# Patient Record
Sex: Female | Born: 1985 | Race: White | Hispanic: No | Marital: Single | Smoking: Current every day smoker
Health system: Southern US, Community
[De-identification: ages and names within clinical notes are randomized; demographics above are authoritative.]

## PROBLEM LIST (undated history)

## (undated) DIAGNOSIS — N809 Endometriosis, unspecified: Secondary | ICD-10-CM

## (undated) DIAGNOSIS — N739 Female pelvic inflammatory disease, unspecified: Secondary | ICD-10-CM

## (undated) DIAGNOSIS — F1911 Other psychoactive substance abuse, in remission: Secondary | ICD-10-CM

## (undated) HISTORY — PX: LAPAROSCOPY: SHX197

---

## 2004-01-05 ENCOUNTER — Emergency Department: Payer: Self-pay | Admitting: Internal Medicine

## 2005-09-06 ENCOUNTER — Emergency Department: Payer: Self-pay | Admitting: Emergency Medicine

## 2005-09-08 ENCOUNTER — Emergency Department: Payer: Self-pay | Admitting: Unknown Physician Specialty

## 2005-10-09 ENCOUNTER — Inpatient Hospital Stay: Payer: Self-pay | Admitting: Unknown Physician Specialty

## 2005-10-09 ENCOUNTER — Ambulatory Visit: Payer: Self-pay | Admitting: Unknown Physician Specialty

## 2005-11-07 ENCOUNTER — Ambulatory Visit: Payer: Self-pay | Admitting: Unknown Physician Specialty

## 2006-04-09 ENCOUNTER — Observation Stay: Payer: Self-pay | Admitting: Obstetrics & Gynecology

## 2006-04-10 ENCOUNTER — Ambulatory Visit: Payer: Self-pay

## 2006-07-19 ENCOUNTER — Inpatient Hospital Stay: Payer: Self-pay

## 2008-05-10 ENCOUNTER — Observation Stay: Payer: Self-pay

## 2008-05-20 ENCOUNTER — Inpatient Hospital Stay: Payer: Self-pay

## 2009-12-13 ENCOUNTER — Observation Stay: Payer: Self-pay

## 2010-01-15 ENCOUNTER — Observation Stay: Payer: Self-pay

## 2010-01-20 ENCOUNTER — Inpatient Hospital Stay: Payer: Self-pay

## 2011-07-11 ENCOUNTER — Encounter: Payer: Self-pay | Admitting: Obstetrics and Gynecology

## 2011-09-20 ENCOUNTER — Inpatient Hospital Stay: Payer: Self-pay

## 2011-09-21 LAB — CBC WITH DIFFERENTIAL/PLATELET
Basophil #: 0 10*3/uL (ref 0.0–0.1)
Eosinophil %: 0.1 %
HCT: 34.3 % — ABNORMAL LOW (ref 35.0–47.0)
Lymphocyte #: 2.6 10*3/uL (ref 1.0–3.6)
MCH: 31.2 pg (ref 26.0–34.0)
MCV: 93 fL (ref 80–100)
Monocyte %: 4.9 %
Neutrophil #: 15.2 10*3/uL — ABNORMAL HIGH (ref 1.4–6.5)
RDW: 13 % (ref 11.5–14.5)
WBC: 18.8 10*3/uL — ABNORMAL HIGH (ref 3.6–11.0)

## 2013-05-08 ENCOUNTER — Inpatient Hospital Stay: Payer: Self-pay

## 2013-05-09 LAB — CBC WITH DIFFERENTIAL/PLATELET
BASOS ABS: 0 10*3/uL (ref 0.0–0.1)
Basophil %: 0.3 %
EOS ABS: 0.1 10*3/uL (ref 0.0–0.7)
EOS PCT: 0.9 %
HCT: 35.1 % (ref 35.0–47.0)
HGB: 11.3 g/dL — ABNORMAL LOW (ref 12.0–16.0)
LYMPHS PCT: 17 %
Lymphocyte #: 2.3 10*3/uL (ref 1.0–3.6)
MCH: 29.4 pg (ref 26.0–34.0)
MCHC: 32.1 g/dL (ref 32.0–36.0)
MCV: 92 fL (ref 80–100)
MONOS PCT: 6.7 %
Monocyte #: 0.9 x10 3/mm (ref 0.2–0.9)
Neutrophil #: 10.2 10*3/uL — ABNORMAL HIGH (ref 1.4–6.5)
Neutrophil %: 75.1 %
Platelet: 424 10*3/uL (ref 150–440)
RBC: 3.83 10*6/uL (ref 3.80–5.20)
RDW: 14.3 % (ref 11.5–14.5)
WBC: 13.6 10*3/uL — AB (ref 3.6–11.0)

## 2013-05-09 LAB — DRUG SCREEN, URINE
AMPHETAMINES, UR SCREEN: NEGATIVE (ref ?–1000)
Barbiturates, Ur Screen: NEGATIVE (ref ?–200)
Benzodiazepine, Ur Scrn: NEGATIVE (ref ?–200)
CANNABINOID 50 NG, UR ~~LOC~~: NEGATIVE (ref ?–50)
Cocaine Metabolite,Ur ~~LOC~~: NEGATIVE (ref ?–300)
MDMA (Ecstasy)Ur Screen: NEGATIVE (ref ?–500)
Methadone, Ur Screen: NEGATIVE (ref ?–300)
OPIATE, UR SCREEN: POSITIVE (ref ?–300)
Phencyclidine (PCP) Ur S: NEGATIVE (ref ?–25)
TRICYCLIC, UR SCREEN: NEGATIVE (ref ?–1000)

## 2013-05-09 LAB — HEMATOCRIT: HCT: 32.3 % — ABNORMAL LOW (ref 35.0–47.0)

## 2014-01-11 ENCOUNTER — Emergency Department: Payer: Self-pay | Admitting: Emergency Medicine

## 2014-01-11 LAB — CBC WITH DIFFERENTIAL/PLATELET
Basophil #: 0.1 10*3/uL (ref 0.0–0.1)
Basophil %: 0.7 %
EOS ABS: 0.2 10*3/uL (ref 0.0–0.7)
Eosinophil %: 1.4 %
HCT: 40 % (ref 35.0–47.0)
HGB: 13.4 g/dL (ref 12.0–16.0)
LYMPHS PCT: 21.1 %
Lymphocyte #: 3.1 10*3/uL (ref 1.0–3.6)
MCH: 30.5 pg (ref 26.0–34.0)
MCHC: 33.4 g/dL (ref 32.0–36.0)
MCV: 91 fL (ref 80–100)
MONO ABS: 1 x10 3/mm — AB (ref 0.2–0.9)
Monocyte %: 7 %
NEUTROS PCT: 69.8 %
Neutrophil #: 10.4 10*3/uL — ABNORMAL HIGH (ref 1.4–6.5)
Platelet: 445 10*3/uL — ABNORMAL HIGH (ref 150–440)
RBC: 4.38 10*6/uL (ref 3.80–5.20)
RDW: 15 % — ABNORMAL HIGH (ref 11.5–14.5)
WBC: 14.9 10*3/uL — AB (ref 3.6–11.0)

## 2014-01-11 LAB — URINALYSIS, COMPLETE
Bilirubin,UR: NEGATIVE
Blood: NEGATIVE
Glucose,UR: NEGATIVE mg/dL (ref 0–75)
Ketone: NEGATIVE
NITRITE: POSITIVE
Ph: 6 (ref 4.5–8.0)
Protein: NEGATIVE
Specific Gravity: 1.02 (ref 1.003–1.030)

## 2014-01-11 LAB — COMPREHENSIVE METABOLIC PANEL
ALBUMIN: 3.3 g/dL — AB (ref 3.4–5.0)
ALT: 17 U/L
AST: 20 U/L (ref 15–37)
Alkaline Phosphatase: 131 U/L — ABNORMAL HIGH
Anion Gap: 8 (ref 7–16)
BILIRUBIN TOTAL: 0.1 mg/dL — AB (ref 0.2–1.0)
BUN: 12 mg/dL (ref 7–18)
Calcium, Total: 8.4 mg/dL — ABNORMAL LOW (ref 8.5–10.1)
Chloride: 110 mmol/L — ABNORMAL HIGH (ref 98–107)
Co2: 28 mmol/L (ref 21–32)
Creatinine: 0.68 mg/dL (ref 0.60–1.30)
EGFR (African American): >60
EGFR (Non-African Amer.): >60
Glucose: 85 mg/dL (ref 65–99)
Osmolality: 290 (ref 275–301)
POTASSIUM: 3.9 mmol/L (ref 3.5–5.1)
SODIUM: 146 mmol/L — AB (ref 136–145)
TOTAL PROTEIN: 7.5 g/dL (ref 6.4–8.2)

## 2014-01-11 LAB — LIPASE, BLOOD: Lipase: 140 U/L (ref 73–393)

## 2014-01-13 LAB — URINE CULTURE

## 2014-01-26 ENCOUNTER — Emergency Department: Payer: Self-pay | Admitting: Emergency Medicine

## 2014-01-26 LAB — COMPREHENSIVE METABOLIC PANEL
ALBUMIN: 3.4 g/dL (ref 3.4–5.0)
ALK PHOS: 115 U/L
AST: 16 U/L (ref 15–37)
Anion Gap: 3 — ABNORMAL LOW (ref 7–16)
BILIRUBIN TOTAL: 0.2 mg/dL (ref 0.2–1.0)
BUN: 12 mg/dL (ref 7–18)
CO2: 31 mmol/L (ref 21–32)
Calcium, Total: 8.7 mg/dL (ref 8.5–10.1)
Chloride: 110 mmol/L — ABNORMAL HIGH (ref 98–107)
Creatinine: 0.74 mg/dL (ref 0.60–1.30)
EGFR (Non-African Amer.): >60
Glucose: 74 mg/dL (ref 65–99)
OSMOLALITY: 285 (ref 275–301)
Potassium: 3.9 mmol/L (ref 3.5–5.1)
SGPT (ALT): 15 U/L
SODIUM: 144 mmol/L (ref 136–145)
Total Protein: 7.5 g/dL (ref 6.4–8.2)

## 2014-01-26 LAB — URINALYSIS, COMPLETE
BLOOD: NEGATIVE
Bilirubin,UR: NEGATIVE
Glucose,UR: NEGATIVE mg/dL (ref 0–75)
Ketone: NEGATIVE
Nitrite: NEGATIVE
Ph: 5 (ref 4.5–8.0)
Protein: NEGATIVE
SPECIFIC GRAVITY: 1.019 (ref 1.003–1.030)
Squamous Epithelial: 14
WBC UR: 26 /HPF (ref 0–5)

## 2014-01-26 LAB — WET PREP, GENITAL

## 2014-01-26 LAB — CBC
HCT: 41.9 % (ref 35.0–47.0)
HGB: 13.6 g/dL (ref 12.0–16.0)
MCH: 29.7 pg (ref 26.0–34.0)
MCHC: 32.5 g/dL (ref 32.0–36.0)
MCV: 91 fL (ref 80–100)
PLATELETS: 439 10*3/uL (ref 150–440)
RBC: 4.59 10*6/uL (ref 3.80–5.20)
RDW: 14.9 % — ABNORMAL HIGH (ref 11.5–14.5)
WBC: 13.3 10*3/uL — ABNORMAL HIGH (ref 3.6–11.0)

## 2014-01-27 LAB — GC/CHLAMYDIA PROBE AMP

## 2014-01-28 LAB — URINE CULTURE

## 2014-07-19 NOTE — H&P (Signed)
L&D Evaluation:  History Expanded:  HPI 29 yo [id number] at 4073w6d gestational age by LMP.  Pregnancy complicated by late entry to prenatal care.  She also has a history of delivery at home with her prior pregnancy.  She presented to L&D with contractions onset approximately 45 minutes prior to arrival to L&D.  She went on to have a precipitous spontaneous vaginal delivery (see delivery note).  She smokes ~1ppd cigarettes.  She has a past history of illicit drug use.  She had a negative urine drug screen in the beginning of February.   Gravida 7   Term 4   PreTerm 0   Abortion 2   Living 4   Blood Type (Maternal) O positive   Group B Strep Results Maternal (Result >5wks must be treated as unknown) unknown/result > 5 weeks ago   Maternal HIV Negative   Maternal Syphilis Ab Nonreactive   Maternal Varicella Immune   Rubella Results (Maternal) immune   [facility]   Patient's Medical History endometriosis   Patient's Surgical History diagnostic laparoscopy   Medications Pre Natal Vitamins   Allergies PCN   Social History tobacco  history of drug use   Family History Non-Contributory   ROS:  ROS All systems were reviewed.  HEENT, CNS, GI, GU, Respiratory, CV, Renal and Musculoskeletal systems were found to be normal., unless otherwise noted in HPI   Exam:  Vital Signs Afebrile, vitals within normal limits   General mild-moderate distress with contractions   Chest clear   Heart normal sinus rhythm   Abdomen gravid, non-tender   Estimated Fetal Weight Average for gestational age   Fetal Position ceph   Edema no edema   Pelvic 4cm at presentation, quickly progressing to fully dilated   Mebranes ruptured clear fluid at presentation   Description clear   FHT normal rate with no decels   FHT Description occasional variable decelerations   Ucx regular   Skin no lesions   Impression:  Impression active labor, PTL   Plan:  Comments Admit for labor.   Patient delivered precipitously.  See delivery note. urine drug screen for history of drug use. routine labs. patient desires BTL.  Will address with her.  paperwork signed in early February.   Electronic Signatures: Conard NovakJackson, Fantasia Jinkins D (MD)  (Signed 28-Feb-15 20:09)  Authored: L&D Evaluation   Last Updated: 28-Feb-15 20:09 by Conard NovakJackson, Recie Cirrincione D (MD)

## 2014-07-19 NOTE — H&P (Signed)
L&D Evaluation:  History:   HPI 29 y/o G6P3023 @ 37/6wks EDC 10/05/11 arrives after rapid labor and delivery at home. Per FOB, patient had some cramping that begin [place] [facility] [employer], he was preparing a bath when the water broke and she pushed once and the baby delivered, baby crying and pink. He had alerted EMS and they arrived after the delivery. Placenta delivered in postpartum room-see delivery note. 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 Multiple +UDS at [facility]. Adjustment disorder-depression anxiety. Poor weight gain~10#, low prepregnant BMI. GBS negative.    Presents with baby in arms    Medications Pre Natal Vitamins    Allergies NKDA    Social History tobacco  1ppd currrent (2ppd prior to pregnancy)    Family History Non-Contributory  FH Sotos syndrome and Absent thyroid (see [facility] perinatology notes)   ROS:   ROS All systems were reviewed.  HEENT, CNS, GI, GU, Respiratory, CV, Renal and Musculoskeletal systems were found to be normal.   Exam:   Vital Signs stable    Urine Protein not completed    General Anxious, tearful but calms with reassurance    Mental Status clear    Chest clear    Heart normal sinus rhythm    Edema no edema    Reflexes 2+   Impression:   Impression Delivered well female precipitously at home 37/6   Plan:   Comments Placenta delivered intact, intact perineum-see delivery note. NEO/NSY notified of pts history.   Electronic Signatures: Albertina ParrLugiano, Etta Gassett B (CNM)  (Signed 12-Jul-13 23:11)  Authored: L&D Evaluation   Last Updated: 12-Jul-13 23:11 by Albertina ParrLugiano, Adelita Hone B (CNM)

## 2014-11-02 ENCOUNTER — Emergency Department
Admission: EM | Admit: 2014-11-02 | Discharge: 2014-11-02 | Disposition: A | Discharge status: Home / Self Care | Payer: Self-pay | Attending: Student | Admitting: Student

## 2014-11-02 ENCOUNTER — Encounter: Payer: Self-pay | Admitting: *Deleted

## 2014-11-02 DIAGNOSIS — R102 Pelvic and perineal pain unspecified side: Secondary | ICD-10-CM

## 2014-11-02 DIAGNOSIS — Z3202 Encounter for pregnancy test, result negative: Secondary | ICD-10-CM | POA: Insufficient documentation

## 2014-11-02 DIAGNOSIS — Z880 Allergy status to penicillin: Secondary | ICD-10-CM | POA: Insufficient documentation

## 2014-11-02 DIAGNOSIS — N730 Acute parametritis and pelvic cellulitis: Secondary | ICD-10-CM

## 2014-11-02 DIAGNOSIS — Z720 Tobacco use: Secondary | ICD-10-CM | POA: Insufficient documentation

## 2014-11-02 LAB — URINALYSIS COMPLETE WITH MICROSCOPIC (ARMC ONLY)
Bilirubin Urine: NEGATIVE
Glucose, UA: NEGATIVE mg/dL
Hgb urine dipstick: NEGATIVE
Ketones, ur: NEGATIVE mg/dL
Leukocytes, UA: NEGATIVE
NITRITE: NEGATIVE
PROTEIN: NEGATIVE mg/dL
Specific Gravity, Urine: 1.018 (ref 1.005–1.030)
pH: 7 (ref 5.0–8.0)

## 2014-11-02 LAB — CHLAMYDIA/NGC RT PCR (ARMC ONLY)
CHLAMYDIA TR: NOT DETECTED
N GONORRHOEAE: NOT DETECTED

## 2014-11-02 LAB — WET PREP, GENITAL
CLUE CELLS WET PREP: NONE SEEN
Trich, Wet Prep: NONE SEEN
YEAST WET PREP: NONE SEEN

## 2014-11-02 LAB — PREGNANCY, URINE: PREG TEST UR: NEGATIVE

## 2014-11-02 MED ORDER — ONDANSETRON 4 MG PO TBDP: 4.0000 mg | ORAL_TABLET | Freq: Once | ORAL | Status: DC

## 2014-11-02 MED ORDER — LEVOFLOXACIN 500 MG PO TABS: 500.0000 mg | ORAL_TABLET | Freq: Two times a day (BID) | ORAL | Status: DC

## 2014-11-02 MED ORDER — LEVOFLOXACIN 500 MG PO TABS
500.0000 mg | ORAL_TABLET | Freq: Once | ORAL | Status: AC
  Administered 2014-11-02: 500 mg via ORAL
  Filled 2014-11-02: qty 1

## 2014-11-02 MED ORDER — AZITHROMYCIN 250 MG PO TABS
ORAL_TABLET | ORAL | Status: AC
  Administered 2014-11-02: 1000 mg
  Filled 2014-11-02: qty 4

## 2014-11-02 MED ORDER — OXYCODONE-ACETAMINOPHEN 5-325 MG PO TABS
ORAL_TABLET | ORAL | Status: AC
  Administered 2014-11-02: 16:00:00
  Filled 2014-11-02: qty 2

## 2014-11-02 MED ORDER — IBUPROFEN 600 MG PO TABS: 600.0000 mg | ORAL_TABLET | Freq: Four times a day (QID) | ORAL | Status: DC | PRN

## 2014-11-02 MED ORDER — AZITHROMYCIN 1 G PO PACK
2.0000 g | PACK | Freq: Once | ORAL | Status: AC
  Administered 2014-11-02: 1 g via ORAL
  Filled 2014-11-02: qty 2

## 2014-11-02 NOTE — ED Provider Notes (Addendum)
[facility] Emergency Department Provider Note  ____________________________________________  Time seen: Approximately 3:16 PM  I have reviewed the triage vital signs and the nursing notes.   HISTORY  Chief Complaint Pelvic Pain    HPI Andrea Mcknight is a 29 y.o. female with no chronic medical problems presents for evaluation of vaginal onset diffuse lower abdominal/pelvic pain, constant since onset for the past 3 days. Currently pain is of moderate severity, no modifying factors. Nausea, vomiting, diarrhea, fevers or chills. Denies any abnormal vaginal bleeding or vaginal discharge. States she had similar pain when she was diagnosed with a sexually transmitted infection and she has been having unprotected sex and is concerned for repeat STI.   History reviewed. No pertinent past medical history.  There are no active problems to display for this patient.   History reviewed. No pertinent past surgical history.  No current outpatient prescriptions on file.  Allergies Penicillins  No family history on file.  Social History Social History  Substance Use Topics  . Smoking status: Current Every Day Smoker -- 1.00 packs/day    Types: Cigarettes  . Smokeless tobacco: None  . Alcohol Use: No    Review of Systems Constitutional: No fever/chills Eyes: No visual changes. ENT: No sore throat. Cardiovascular: Denies chest pain. Respiratory: Denies shortness of breath. Gastrointestinal: + abdominal pain.  No nausea, no vomiting.  No diarrhea.  No constipation. Genitourinary: Negative for dysuria. Musculoskeletal: Negative for back pain. Skin: Negative for rash. Neurological: Negative for headaches, focal weakness or numbness.  10-point ROS otherwise negative.  ____________________________________________   PHYSICAL EXAM:  VITAL SIGNS: ED Triage Vitals  Enc Vitals Group     BP 11/02/14 1221 114/73 mmHg     Pulse Rate 11/02/14 1221 98     Resp  11/02/14 1221 18     Temp 11/02/14 1221 98 F (36.7 C)     Temp Source 11/02/14 1221 Oral     SpO2 11/02/14 1221 100 %     Weight 11/02/14 1221 130 lb (58.968 kg)     Height 11/02/14 1221 5\' 5"  (1.651 m)     Head Cir --      Peak Flow --      Pain Score 11/02/14 1222 8     Pain Loc --      Pain Edu? --      Excl. in GC? --     Constitutional: Alert and oriented. Well appearing and in no acute distress. Eyes: Conjunctivae are normal. PERRL. EOMI. Head: Atraumatic. Nose: No congestion/rhinnorhea. Mouth/Throat: Mucous membranes are moist.  Oropharynx non-erythematous. Neck: No stridor.   Cardiovascular: Normal rate, regular rhythm. Grossly normal heart sounds.  Good peripheral circulation. Respiratory: Normal respiratory effort.  No retractions. Lungs CTAB. Gastrointestinal: Soft with faint diffuse lower abdominal tenderness. No rebound, no guarding. No distention. No abdominal bruits. No CVA tenderness. Pelvic: Copious thick white discharge from a closed os, + CMT and + BMT Musculoskeletal: No lower extremity tenderness nor edema.  No joint effusions. Neurologic:  Normal speech and language. No gross focal neurologic deficits are appreciated. No gait instability. Skin:  Skin is warm, dry and intact. No rash noted. Psychiatric: Mood and affect are normal. Speech and behavior are normal.  ____________________________________________   LABS (all labs ordered are listed, but only abnormal results are displayed)  Labs Reviewed  WET PREP, GENITAL - Abnormal; Notable for the following:    WBC, Wet Prep HPF POC MODERATE (*)    All other components  within normal limits  URINALYSIS COMPLETEWITH MICROSCOPIC ([facility] ONLY) - Abnormal; Notable for the following:    Color, Urine YELLOW (*)    APPearance HAZY (*)    Bacteria, UA RARE (*)    Squamous Epithelial / LPF 6-30 (*)    All other components within normal limits  CHLAMYDIA/NGC RT PCR ([facility] ONLY)  PREGNANCY, URINE    ____________________________________________  EKG  none ____________________________________________  RADIOLOGY  none ____________________________________________   PROCEDURES  Procedure(s) performed: None  Critical Care performed: No  ____________________________________________   INITIAL IMPRESSION / ASSESSMENT AND PLAN / ED COURSE  Pertinent labs & imaging results that were available during my care of the patient were reviewed by me and considered in my medical decision making (see chart for details).  Andrea Mcknight is a 29 y.o. female with no chronic medical problems presents for evaluation of vaginal onset diffuse lower abdominal/pelvic pain, constant since onset for the past 3 days. On exam, she is very well-appearing and in no acute distress. Vital signs stable, she is afebrile. She has normal bowel sounds with faint lower abdominal tenderness, no laterality, no rebound, no guarding. Doubt any acute ovarian pathology such as torsion or TOA. Her exam is concerning for pelvic inflammatory disease given copious vaginal discharge, bimanual tenderness as well as cervical motion tenderness. She is severe anaphylactic allergy to penicillin so will treat with Levaquin for 14 days as well as one large dose of azithromycin here according to current CDC recommendations. Pain is improved and this time. Urine pregnancy is negative. Urinalysis is not consistent with infection. Wet prep with no trichomonas, no yeast, no clue cells although there are moderate amount of white blood cells. GC chlamydia still pending however given her exam, we'll treat empirically. Discussed return precautions, need for close PCP follow-up and she is comfortable with the discharge plan. ____________________________________________   FINAL CLINICAL IMPRESSION(S) / ED DIAGNOSES  Final diagnoses:  Pelvic pain in female  PID (acute pelvic inflammatory disease)      Gayla Doss, MD 11/02/14  1725  Gayla Doss, MD 11/02/14 [phone]

## 2014-11-02 NOTE — ED Notes (Signed)
Pt reports pelvic pain starting 3 days ago, pt denies any other symptoms

## 2014-11-08 ENCOUNTER — Telehealth: Payer: Self-pay | Admitting: Emergency Medicine

## 2014-11-08 NOTE — ED Notes (Signed)
Pharmacy called to question levaquin dosage of 2 per day.   Per dr York Cerise will change to levaquin 500 daily for 14 days.

## 2015-09-20 ENCOUNTER — Encounter: Payer: Self-pay | Admitting: *Deleted

## 2015-09-20 ENCOUNTER — Emergency Department
Admission: EM | Admit: 2015-09-20 | Discharge: 2015-09-20 | Disposition: A | Discharge status: Home / Self Care | Payer: Self-pay | Attending: Emergency Medicine | Admitting: Emergency Medicine

## 2015-09-20 ENCOUNTER — Emergency Department: Payer: Self-pay

## 2015-09-20 DIAGNOSIS — Y999 Unspecified external cause status: Secondary | ICD-10-CM | POA: Insufficient documentation

## 2015-09-20 DIAGNOSIS — F17210 Nicotine dependence, cigarettes, uncomplicated: Secondary | ICD-10-CM | POA: Insufficient documentation

## 2015-09-20 DIAGNOSIS — S61512A Laceration without foreign body of left wrist, initial encounter: Secondary | ICD-10-CM

## 2015-09-20 DIAGNOSIS — Y939 Activity, unspecified: Secondary | ICD-10-CM | POA: Insufficient documentation

## 2015-09-20 DIAGNOSIS — S6392XA Sprain of unspecified part of left wrist and hand, initial encounter: Secondary | ICD-10-CM | POA: Insufficient documentation

## 2015-09-20 DIAGNOSIS — S61522A Laceration with foreign body of left wrist, initial encounter: Secondary | ICD-10-CM | POA: Insufficient documentation

## 2015-09-20 DIAGNOSIS — W1839XA Other fall on same level, initial encounter: Secondary | ICD-10-CM | POA: Insufficient documentation

## 2015-09-20 DIAGNOSIS — Y929 Unspecified place or not applicable: Secondary | ICD-10-CM | POA: Insufficient documentation

## 2015-09-20 MED ORDER — LIDOCAINE HCL (PF) 1 % IJ SOLN
INTRAMUSCULAR | Status: AC
  Filled 2015-09-20: qty 5

## 2015-09-20 MED ORDER — TRAMADOL HCL 50 MG PO TABS: 50.0000 mg | ORAL_TABLET | Freq: Four times a day (QID) | ORAL | Status: DC | PRN

## 2015-09-20 MED ORDER — TETANUS-DIPHTH-ACELL PERTUSSIS 5-2.5-18.5 LF-MCG/0.5 IM SUSP
0.5000 mL | Freq: Once | INTRAMUSCULAR | Status: AC
  Administered 2015-09-20: 0.5 mL via INTRAMUSCULAR
  Filled 2015-09-20: qty 0.5

## 2015-09-20 MED ORDER — LIDOCAINE HCL (PF) 1 % IJ SOLN: 5.0000 mL | Freq: Once | INTRAMUSCULAR | Status: DC

## 2015-09-20 NOTE — ED Notes (Signed)
See triage note  States she tripped and fell in yard   Laceration noted to left wrist  Denies any other injury

## 2015-09-20 NOTE — ED Notes (Signed)
Pt tripped and fell in yard, pt has laceration to left wrist, no bleeding noted, wound cleansed dressing applied

## 2015-09-20 NOTE — ED Provider Notes (Signed)
[facility] Emergency Department Provider Note  ____________________________________________  Time seen: Approximately 3:10 PM  I have reviewed the triage vital signs and the nursing notes.   HISTORY  Chief Complaint Laceration   HPI Andrea Mcknight is a 30 y.o. female who presents to the emergency department for evaluation of a laceration of the left wrist she sustained after a mechanical, non-syncopal fall at approximately 1:00AM. She states she has pain with attempt to extend the fingers of the hand, especially the ring finger. She is unsure of her last tetanus.   History reviewed. No pertinent past medical history.  There are no active problems to display for this patient.   History reviewed. No pertinent past surgical history.  Current Outpatient Rx  Name  Route  Sig  Dispense  Refill  . ibuprofen (ADVIL,MOTRIN) 600 MG tablet   Oral   Take 1 tablet (600 mg total) by mouth every 6 (six) hours as needed for moderate pain.   15 tablet   0   . levofloxacin (LEVAQUIN) 500 MG tablet   Oral   Take 1 tablet (500 mg total) by mouth 2 (two) times daily.   28 tablet   0   . traMADol (ULTRAM) 50 MG tablet   Oral   Take 1 tablet (50 mg total) by mouth every 6 (six) hours as needed.   12 tablet   0     Allergies Penicillins  No family history on file.  Social History Social History  Substance Use Topics  . Smoking status: Current Every Day Smoker -- 1.00 packs/day    Types: Cigarettes  . Smokeless tobacco: None  . Alcohol Use: No    Review of Systems  Constitutional: Negative for fever/chills Respiratory: Negative for shortness of breath. Musculoskeletal: Positive for pain. Skin: Positive for laceration. Neurological: Negative for headaches, focal weakness or numbness. ____________________________________________   PHYSICAL EXAM:  VITAL SIGNS: ED Triage Vitals  Enc Vitals Group     BP 09/20/15 1500 116/65 mmHg     Pulse Rate  09/20/15 1500 91     Resp 09/20/15 1500 20     Temp 09/20/15 1500 98 F (36.7 C)     Temp Source 09/20/15 1500 Oral     SpO2 09/20/15 1500 98 %     Weight 09/20/15 1500 120 lb (54.432 kg)     Height 09/20/15 1500  (1.651 m)     Head Cir --      Peak Flow --      Pain Score 09/20/15 1500 8     Pain Loc --      Pain Edu? --      Excl. in GC? --      Constitutional: Alert and oriented. Well appearing and in no acute distress. Eyes: Conjunctivae are normal. EOMI. Nose: No congestion/rhinnorhea. Mouth/Throat: Mucous membranes are moist.   Neck: No stridor. Cardiovascular: Good peripheral circulation. Respiratory: Normal respiratory effort.  No retractions. Musculoskeletal: Limited ROM of all fingers of the left hand, especially the Ring finger. Neurologic:  Normal speech and language. No gross focal neurologic deficits are appreciated. Skin:  3 cm laceration noted to the volar aspect of the left wrist just proximal to the thenar eminence.   ____________________________________________   LABS (all labs ordered are listed, but only abnormal results are displayed)  Labs Reviewed - No data to display ____________________________________________  EKG   ____________________________________________  RADIOLOGY  Patient unable to tolerate full extension of the left hand for  complete visualization of the bony structure. No obvious dislocations or fractures in the images obtained. I, Kem Boroughsari Tanelle Lanzo, personally viewed and evaluated these images (plain radiographs) as part of my medical decision making, as well as reviewing the written report by the radiologist.   ____________________________________________   PROCEDURES  Procedure(s) performed:  LACERATION REPAIR Performed by: Kem Boroughsari Ruchel Brandenburger  Consent: Verbal consent obtained.  Consent given by: patient  Prepped and Draped in normal sterile fashion  Wound explored: 2 small foreign bodies removed.  Laceration Location:  volar aspect of the left wrist just proximal to the thenar eminence.   Laceration Length: 3 cm  Local anesthetic: lidocaine 1 % without epinephrine  Anesthetic total: 5 ml  Irrigation method: syringe  Amount of cleaning: Copious   Skin closure: 5-0 Ethilon   Number of sutures: 4  Technique: Simple interrupted   Patient tolerance: Patient tolerated the procedure well with no immediate complications.  SPLINT APPLICATION Authorized by: Kem Boroughsari Tomoki Lucken Consent: Verbal consent obtained. Risks and benefits: risks, benefits and alternatives were discussed Consent given by: patient Splint applied by: RN Location details: Left fingers to mid forearm  Splint type: OCL  Supplies used: OCL and Ace  Post-procedure: The splinted body part was neurovascularly unchanged following the procedure. Patient tolerance: Patient tolerated the procedure well with no immediate complications.     ____________________________________________   INITIAL IMPRESSION / ASSESSMENT AND PLAN / ED COURSE  Pertinent labs & imaging results that were available during my care of the patient were reviewed by me and considered in my medical decision making (see chart for details).  Patient was instructed that she will need to follow-up with orthopedics for further examination and evaluation of her left hand, specifically the ring finger. She was advised that this may be a simple sprain but could be a tendon injury which will need close follow-up in order to regain full function of the finger. Wound care instructions were discussed. She is aware that the sutures need to be removed in 10-12 days. Return precautions discussed as well.  Discharge Medication List as of 09/20/2015  4:20 PM    START taking these medications   Details  traMADol (ULTRAM) 50 MG tablet Take 1 tablet (50 mg total) by mouth every 6 (six) hours as needed., Starting 09/20/2015, Until Discontinued, Print         ____________________________________________   INITIAL IMPRESSION / ASSESSMENT AND PLAN / ED COURSE  ____________________________________________   FINAL CLINICAL IMPRESSION(S) / ED DIAGNOSES  Final diagnoses:  Hand sprain, left, initial encounter  Laceration of wrist, left, initial encounter       Chinita PesterCari B Felicitas Sine, FNP 09/20/15 1804  Nita Sicklearolina Veronese, MD 09/20/15 2358

## 2015-09-22 ENCOUNTER — Encounter: Payer: Self-pay | Admitting: Emergency Medicine

## 2015-09-22 ENCOUNTER — Emergency Department
Admission: EM | Admit: 2015-09-22 | Discharge: 2015-09-22 | Disposition: A | Discharge status: Home / Self Care | Payer: Self-pay | Attending: Emergency Medicine | Admitting: Emergency Medicine

## 2015-09-22 DIAGNOSIS — F17210 Nicotine dependence, cigarettes, uncomplicated: Secondary | ICD-10-CM | POA: Insufficient documentation

## 2015-09-22 DIAGNOSIS — Z3A00 Weeks of gestation of pregnancy not specified: Secondary | ICD-10-CM | POA: Insufficient documentation

## 2015-09-22 DIAGNOSIS — O209 Hemorrhage in early pregnancy, unspecified: Secondary | ICD-10-CM | POA: Insufficient documentation

## 2015-09-22 LAB — CBC WITH DIFFERENTIAL/PLATELET
Basophils Absolute: 0 10*3/uL (ref 0–0.1)
Basophils Relative: 1 %
EOS ABS: 0.2 10*3/uL (ref 0–0.7)
Eosinophils Relative: 2 %
HEMATOCRIT: 30.8 % — AB (ref 35.0–47.0)
HEMOGLOBIN: 10.9 g/dL — AB (ref 12.0–16.0)
LYMPHS ABS: 3.8 10*3/uL — AB (ref 1.0–3.6)
Lymphocytes Relative: 37 %
MCH: 30.6 pg (ref 26.0–34.0)
MCHC: 35.3 g/dL (ref 32.0–36.0)
MCV: 86.7 fL (ref 80.0–100.0)
MONOS PCT: 7 %
Monocytes Absolute: 0.7 10*3/uL (ref 0.2–0.9)
NEUTROS ABS: 5.6 10*3/uL (ref 1.4–6.5)
NEUTROS PCT: 53 %
Platelets: 369 10*3/uL (ref 150–440)
RBC: 3.55 MIL/uL — AB (ref 3.80–5.20)
RDW: 16.7 % — ABNORMAL HIGH (ref 11.5–14.5)
WBC: 10.3 10*3/uL (ref 3.6–11.0)

## 2015-09-22 LAB — ABO/RH: ABO/RH(D): O POS

## 2015-09-22 LAB — POCT PREGNANCY, URINE: Preg Test, Ur: NEGATIVE

## 2015-09-22 LAB — HCG, QUANTITATIVE, PREGNANCY: hCG, Beta Chain, Quant, S: 24 m[IU]/mL — ABNORMAL HIGH (ref <5)

## 2015-09-22 NOTE — ED Notes (Signed)
Pt and sister very concerned regarding wait time. Pt informed of treatment process and md delay. Pt verbalizes understanding.

## 2015-09-22 NOTE — ED Provider Notes (Signed)
[facility] Emergency Department Provider Note   ____________________________________________  Time seen:  I have reviewed the triage vital signs and the triage nursing note.  HISTORY  Chief Complaint Vaginal Bleeding   Historian Patient  HPI Andrea Mcknight is a 30 y.o. female G7 P5 who is here after taking a positive pregnancy test by urine at home before July 4. On July 4 she started having some spotting for 2 days, followed by several days of heavy vaginal bleeding with cramping. She did not seek medical evaluation at that point in time. However she started wondering what is going on and if the pregnancy was okay and since she does not have Medicaid yet and was going to take some time she chose to come to the emergency department for further evaluation today. She has not had any bleeding since that episode nor any abdominal pain today.      History reviewed. No pertinent past medical history.  There are no active problems to display for this patient.   History reviewed. No pertinent past surgical history.  Current Outpatient Rx  Name  Route  Sig  Dispense  Refill  . ibuprofen (ADVIL,MOTRIN) 600 MG tablet   Oral   Take 1 tablet (600 mg total) by mouth every 6 (six) hours as needed for moderate pain.   15 tablet   0   . levofloxacin (LEVAQUIN) 500 MG tablet   Oral   Take 1 tablet (500 mg total) by mouth 2 (two) times daily.   28 tablet   0   . traMADol (ULTRAM) 50 MG tablet   Oral   Take 1 tablet (50 mg total) by mouth every 6 (six) hours as needed.   12 tablet   0     Allergies Penicillins  No family history on file.  Social History Social History  Substance Use Topics  . Smoking status: Current Every Day Smoker -- 1.00 packs/day    Types: Cigarettes  . Smokeless tobacco: Never Used  . Alcohol Use: No    Review of Systems  Constitutional: Negative for fever. Eyes: Negative for visual changes. ENT: Negative for sore  throat. Cardiovascular: Negative for chest pain. Respiratory: Negative for shortness of breath. Gastrointestinal: Negative for abdominal pain, vomiting and diarrhea. Genitourinary: Negative for dysuria. Musculoskeletal: Negative for back pain. Skin: Negative for rash. Neurological: Negative for headache. 10 point Review of Systems otherwise negative ____________________________________________   PHYSICAL EXAM:  VITAL SIGNS: ED Triage Vitals  Enc Vitals Group     BP 09/22/15 2008 139/95 mmHg     Pulse Rate 09/22/15 2008 84     Resp 09/22/15 2008 14     Temp 09/22/15 2008 98.4 F (36.9 C)     Temp Source 09/22/15 2008 Oral     SpO2 09/22/15 2008 100 %     Weight 09/22/15 2008 120 lb (54.432 kg)     Height 09/22/15 2008 5\' 5"  (1.651 m)     Head Cir --      Peak Flow --      Pain Score 09/22/15 2009 0     Pain Loc --      Pain Edu? --      Excl. in GC? --      Constitutional: Alert and oriented. Well appearing and in no distress. HEENT   Head: Normocephalic and atraumatic.      Eyes: Conjunctivae are normal. PERRL. Normal extraocular movements.      Ears:  Nose: No congestion/rhinnorhea.   Mouth/Throat: Mucous membranes are moist.   Neck: No stridor. Cardiovascular/Chest: Normal rate, regular rhythm.  No murmurs, rubs, or gallops. Respiratory: Normal respiratory effort without tachypnea nor retractions. Breath sounds are clear and equal bilaterally. No wheezes/rales/rhonchi. Gastrointestinal: Soft. No distention, no guarding, no rebound. Nontender.   Genitourinary/rectal:Deferred Musculoskeletal: Nontender with normal range of motion in all extremities. No joint effusions.  No lower extremity tenderness.  No edema. Neurologic:  Normal speech and language. No gross or focal neurologic deficits are appreciated. Skin:  Skin is warm, dry and intact. No rash noted. Psychiatric: Mood and affect are normal. Speech and behavior are normal. Patient exhibits  appropriate insight and judgment.  ____________________________________________   EKG I, Governor Rooks, MD, the attending physician have personally viewed and interpreted all ECGs.  None ____________________________________________  LABS (pertinent positives/negatives)  Labs Reviewed  HCG, QUANTITATIVE, PREGNANCY - Abnormal; Notable for the following:    hCG, Beta Chain, Quant, S 24 (*)    All other components within normal limits  CBC WITH DIFFERENTIAL/PLATELET - Abnormal; Notable for the following:    RBC 3.55 (*)    Hemoglobin 10.9 (*)    HCT 30.8 (*)    RDW 16.7 (*)    Lymphs Abs 3.8 (*)    All other components within normal limits  POC URINE PREG, ED  POCT PREGNANCY, URINE  ABO/RH    ____________________________________________  RADIOLOGY All Xrays were viewed by me. Imaging interpreted by Radiologist.  None __________________________________________  PROCEDURES  Procedure(s) performed: None  Critical Care performed: None  ____________________________________________   ED COURSE / ASSESSMENT AND PLAN  Pertinent labs & imaging results that were available during my care of the patient were reviewed by me and considered in my medical decision making (see chart for details).   This patient's giving a history concerning for likely miscarriage about a week ago.  She had a urine pregnancy test that was positive followed by bleeding similar to a period, and came for further evaluation.  Urine pregnancy test was initially noted to be negative, however after another 10-20 minutes it did show positive. Her beta hCG did come back low at 24.  Given that she is not having abdominal pain or bleeding right now, I don't think pelvic exam is indicated right now. I did discuss offering her an ultrasound today, but patient does not want to stick around for this and that she has kids at home.  I don't think it absolutely necessary to insist on this given that she is  essentially asymptomatic now. Her blood type is O+.  I have asked her to follow up with OB/GYN.     CONSULTATIONS:   None   Patient / Family / Caregiver informed of clinical course, medical decision-making process, and agree with plan.   I discussed return precautions, follow-up instructions, and discharged instructions with patient and/or family.   ___________________________________________   FINAL CLINICAL IMPRESSION(S) / ED DIAGNOSES   Final diagnoses:  First trimester bleeding  Likely miscarriage            Note: This dictation was prepared with Dragon dictation. Any transcriptional errors that result from this process are unintentional   Governor Rooks, MD 09/22/15 2155

## 2015-09-22 NOTE — ED Notes (Signed)
Pt states took 3 urine pregnancy tests at home on 09/10/2015 with positive results. Pt states began having vaginal bleeding "like a period" on 09/12/2015. Pt states bleeding stopped on 09/16/2015. Pt states took home pregnancy test today with positive results. Pt states "i just want to make sure everything is ok because it's gonna take too long to get into a doctor because i don't have medicaid." pt appears in no acute distress.

## 2015-09-22 NOTE — ED Notes (Addendum)
Pt removed pregnancy test from trash, states 'this is positive, the line is really faint but it's there." possibly very light pink line only visible in extremely bright light present. Pt assured will know for sure from blood pregnancy test. Pt verbalizes understanding.

## 2015-09-22 NOTE — Discharge Instructions (Signed)
You were evaluated after positive pregnancy test and vaginal bleeding. As we discussed, your blood pregnancy test, beta hCG was very low at 24 today. Given your recent bleeding, I am most suspicious of a miscarriage.  It is still somewhat possible that you are just very early in pregnancy, or that there is an abnormal pregnancy, but without doing the ultrasound today, you do need to follow-up with OB/GYN.  Return to the emergency department for any abdominal pain, dizziness, passing out, or additional heavy bleeding.   Vaginal Bleeding During Pregnancy, First Trimester A small amount of bleeding (spotting) from the vagina is common in early pregnancy. Sometimes the bleeding is normal and is not a problem, and sometimes it is a sign of something serious. Be sure to tell your doctor about any bleeding from your vagina right away. HOME CARE  Watch your condition for any changes.  Follow your doctor's instructions about how active you can be.  If you are on bed rest:  You may need to stay in bed and only get up to use the bathroom.  You may be allowed to do some activities.  If you need help, make plans for someone to help you.  Write down:  The number of pads you use each day.  How often you change pads.  How soaked (saturated) your pads are.  Do not use tampons.  Do not douche.  Do not have sex or orgasms until your doctor says it is okay.  If you pass any tissue from your vagina, save the tissue so you can show it to your doctor.  Only take medicines as told by your doctor.  Do not take aspirin because it can make you bleed.  Keep all follow-up visits as told by your doctor. GET HELP IF:   You bleed from your vagina.  You have cramps.  You have labor pains.  You have a fever that does not go away after you take medicine. GET HELP RIGHT AWAY IF:   You have very bad cramps in your back or belly (abdomen).  You pass large clots or tissue from your vagina.  You  bleed more.  You feel light-headed or weak.  You pass out (faint).  You have chills.  You are leaking fluid or have a gush of fluid from your vagina.  You pass out while pooping (having a bowel movement). MAKE SURE YOU:  Understand these instructions.  Will watch your condition.  Will get help right away if you are not doing well or get worse.   This information is not intended to replace advice given to you by your health care provider. Make sure you discuss any questions you have with your health care provider.   Document Released: 07/12/2013 Document Reviewed: 07/12/2013 Elsevier Interactive Patient Education [employer]2016 Elsevier Inc. I discussed,

## 2015-09-22 NOTE — ED Notes (Signed)
Pt and pt's sister calling this rn back into room. Pt and sister state "that test is positive, look at that faint line." no line visualized by this rn. Pt assured test appears as it comes out of package.  Pt informed will talk to md to see if blood work is necessary.

## 2015-11-02 ENCOUNTER — Emergency Department
Admission: EM | Admit: 2015-11-02 | Discharge: 2015-11-03 | Disposition: A | Discharge status: Home / Self Care | Payer: Self-pay | Attending: Emergency Medicine | Admitting: Emergency Medicine

## 2015-11-02 ENCOUNTER — Encounter: Payer: Self-pay | Admitting: Emergency Medicine

## 2015-11-02 DIAGNOSIS — Z791 Long term (current) use of non-steroidal anti-inflammatories (NSAID): Secondary | ICD-10-CM | POA: Insufficient documentation

## 2015-11-02 DIAGNOSIS — F17210 Nicotine dependence, cigarettes, uncomplicated: Secondary | ICD-10-CM | POA: Insufficient documentation

## 2015-11-02 DIAGNOSIS — Z792 Long term (current) use of antibiotics: Secondary | ICD-10-CM | POA: Insufficient documentation

## 2015-11-02 DIAGNOSIS — K047 Periapical abscess without sinus: Secondary | ICD-10-CM | POA: Insufficient documentation

## 2015-11-02 MED ORDER — LIDOCAINE VISCOUS 2 % MT SOLN
15.0000 mL | Freq: Once | OROMUCOSAL | Status: AC
  Administered 2015-11-03: 15 mL via OROMUCOSAL
  Filled 2015-11-02: qty 15

## 2015-11-02 MED ORDER — CLINDAMYCIN HCL 150 MG PO CAPS: 150.0000 mg | ORAL_CAPSULE | Freq: Four times a day (QID) | ORAL | 0 refills | Status: DC

## 2015-11-02 MED ORDER — OXYCODONE-ACETAMINOPHEN 7.5-325 MG PO TABS: 1.0000 | ORAL_TABLET | ORAL | 0 refills | Status: DC | PRN

## 2015-11-02 MED ORDER — OXYCODONE-ACETAMINOPHEN 5-325 MG PO TABS
2.0000 | ORAL_TABLET | Freq: Once | ORAL | Status: AC
  Administered 2015-11-03: 2 via ORAL
  Filled 2015-11-02: qty 2

## 2015-11-02 NOTE — ED Provider Notes (Signed)
[facility] Emergency Department Provider Note   ____________________________________________   None    (approximate)  I have reviewed the triage vital signs and the nursing notes.   HISTORY  Chief Complaint Abscess    HPI Andrea Mcknight is a 30 y.o. female complain right lower molar pain and edema right lower jaw. Onset of pain began last night patient awakened this morning with swelling. No palliative measures taken for this complaint. Patient rates pain as a 10 over 10. Patient had pain as sharp.   History reviewed. No pertinent past medical history.  There are no active problems to display for this patient.   History reviewed. No pertinent surgical history.  Prior to Admission medications   Medication Sig Start Date End Date Taking? Authorizing Provider  clindamycin (CLEOCIN) 150 MG capsule Take 1 capsule (150 mg total) by mouth 4 (four) times daily. 11/02/15   Joni Reiningonald K Ahron Hulbert, PA-C  ibuprofen (ADVIL,MOTRIN) 600 MG tablet Take 1 tablet (600 mg total) by mouth every 6 (six) hours as needed for moderate pain. 11/02/14   Gayla DossEryka A Gayle, MD  levofloxacin (LEVAQUIN) 500 MG tablet Take 1 tablet (500 mg total) by mouth 2 (two) times daily. 11/02/14   Gayla DossEryka A Gayle, MD  oxyCODONE-acetaminophen (PERCOCET) 7.5-325 MG tablet Take 1 tablet by mouth every 4 (four) hours as needed for severe pain. 11/02/15   Joni Reiningonald K Catelyn Friel, PA-C  traMADol (ULTRAM) 50 MG tablet Take 1 tablet (50 mg total) by mouth every 6 (six) hours as needed. 09/20/15   Chinita Pesterari B Triplett, FNP    Allergies Penicillins and Tramadol  No family history on file.  Social History Social History  Substance Use Topics  . Smoking status: Current Every Day Smoker    Packs/day: 1.00    Types: Cigarettes  . Smokeless tobacco: Never Used  . Alcohol use No    Review of Systems Constitutional: No fever/chills Eyes: No visual changes. ENT: No sore throat. Pain and edema right lower molar  area Cardiovascular: Denies chest pain. Respiratory: Denies shortness of breath. Gastrointestinal: No abdominal pain.  No nausea, no vomiting.  No diarrhea.  No constipation. Genitourinary: Negative for dysuria. Musculoskeletal: Negative for back pain. Skin: Negative for rash. Neurological: Negative for headaches, focal weakness or numbness.    ____________________________________________   PHYSICAL EXAM:  VITAL SIGNS: ED Triage Vitals  Enc Vitals Group     BP 11/02/15 2253 95/66     Pulse Rate 11/02/15 2253 (!) 118     Resp 11/02/15 2253 20     Temp 11/02/15 2253 98.1 F (36.7 C)     Temp Source 11/02/15 2253 Oral     SpO2 11/02/15 2253 99 %     Weight 11/02/15 2254 120 lb (54.4 kg)     Height 11/02/15 2254 5\' 4"  (1.626 m)     Head Circumference --      Peak Flow --      Pain Score 11/02/15 2254 10     Pain Loc --      Pain Edu? --      Excl. in GC? --     Constitutional: Alert and oriented. Well appearing and in no acute distress. Eyes: Conjunctivae are normal. PERRL. EOMI. Head: Atraumatic. Nose: No congestion/rhinnorhea. Mouth/Throat: Mucous membranes are moist.  Oropharynx non-erythematous .Devitalized tooth #22 with edematous gingiva. Neck: No stridor.  No cervical spine tenderness to palpation. Hematological/Lymphatic/Immunilogical: No cervical lymphadenopathy. Cardiovascular: Tachycardic. Grossly normal heart sounds.  Good peripheral circulation. Respiratory:  Normal respiratory effort.  No retractions. Lungs CTAB. Gastrointestinal: Soft and nontender. No distention. No abdominal bruits. No CVA tenderness. Musculoskeletal: No lower extremity tenderness nor edema.  No joint effusions. Neurologic:  Normal speech and language. No gross focal neurologic deficits are appreciated. No gait instability. Skin:  Skin is warm, dry and intact. No rash noted. Psychiatric: Mood and affect are normal. Speech and behavior are  normal.  ____________________________________________   LABS (all labs ordered are listed, but only abnormal results are displayed)  Labs Reviewed - No data to display ____________________________________________  EKG   ____________________________________________  RADIOLOGY   ____________________________________________   PROCEDURES  Procedure(s) performed: None  Procedures  Critical Care performed: No  ____________________________________________   INITIAL IMPRESSION / ASSESSMENT AND PLAN / ED COURSE  Pertinent labs & imaging results that were available during my care of the patient were reviewed by me and considered in my medical decision making (see chart for details).  Dental abscess. Patient given discharge care instructions. Patient given a list dental clinic for follow-up care. Patient given a prescription for clindamycin and Percocets.  Clinical Course     ____________________________________________   FINAL CLINICAL IMPRESSION(S) / ED DIAGNOSES  Final diagnoses:  Dental abscess      NEW MEDICATIONS STARTED DURING THIS VISIT:  New Prescriptions   CLINDAMYCIN (CLEOCIN) 150 MG CAPSULE    Take 1 capsule (150 mg total) by mouth 4 (four) times daily.   OXYCODONE-ACETAMINOPHEN (PERCOCET) 7.5-325 MG TABLET    Take 1 tablet by mouth every 4 (four) hours as needed for severe pain.     Note:  This document was prepared using Dragon voice recognition software and may include unintentional dictation errors.    Joni Reining, PA-C 11/02/15 2348    Charlynne Pander, MD 11/04/15 2135

## 2015-11-02 NOTE — Discharge Instructions (Signed)
On a scheduled appointment from list of dental clinics provided. OPTIONS FOR DENTAL FOLLOW UP CARE  [place] Department of Health and Human Services - Local Safety Net Dental Clinics [email or web address].gov/dph/oralhealth/services/[email or web address].htm   [facility] [phone])  [facility] [phone])  [facility] [phone])  [facility] [phone])  [facility] [phone]) This clinic caters to the indigent population and is on a lottery system. Location: [employer], [address], [address], [place] [facility] Hours: Wednesdays from 6pm - 9pm, patients seen by a lottery system. For dates, call or go to [email or web address] Services: Cleanings, fillings and simple extractions. Payment Options: DENTAL WORK IS FREE OF CHARGE. Bring proof of income or support. Best way to get seen: Arrive at 5:15 pm - this is a lottery, NOT first come/first serve, so arriving earlier will not increase your chances of being seen.     [facility] [phone] Select option 1 for emergencies   Location: [facility], [place], [address], [place] [facility] Hours: No walk-ins accepted - call the day before to schedule an appointment. Check in times are 9:30 am and 1:30 pm. Services: Simple extractions, temporary fillings, pulpectomy/pulp debridement, uncomplicated abscess drainage. Payment Options: PAYMENT IS DUE AT THE TIME OF SERVICE.  Fee is usually $100-200, additional surgical procedures (e.g. abscess drainage) may be extra. Cash, checks, Visa/MasterCard accepted.  Can file Medicaid if patient is covered for dental - patient should call case worker to check. No discount for [facility] patients. Best way to get seen: MUST call the day before and get onto the schedule. Can usually be seen the next 1-2 days. No walk-ins  accepted.     [facility] [phone]   Location: [facility], [address], [place] [facility] Hours: M, W, Th, F 8am or 1:30pm, Tues 9a or 1:30 - first come/first served. Services: Simple extractions, temporary fillings, uncomplicated abscess drainage.  You do not need to be an [place] resident. Payment Options: PAYMENT IS DUE AT THE TIME OF SERVICE. Dental insurance, otherwise sliding scale - bring proof of income or support. Depending on income and treatment needed, cost is usually $50-200. Best way to get seen: Arrive early as it is first come/first served.     [facility] [phone]   Location: [address] Clinic Hours: Mon-Thu 8a-5p Services: Most basic dental services including extractions and fillings. Payment Options: PAYMENT IS DUE AT THE TIME OF SERVICE. Sliding scale, up to 50% off - bring proof if income or support. Medicaid with dental option accepted. Best way to get seen: Call to schedule an appointment, can usually be seen within 2 weeks OR they will try to see walk-ins - show up at 8a or 2p (you may have to wait).     [facility] [phone] [place] RESIDENTS ONLY   Location: [facility], [address], [place], [place] [address] Clinic Hours: By appointment only. Monday - Thursday 8am-5pm, Friday 8am-12pm Services: Cleanings, fillings, extractions. Payment Options: PAYMENT IS DUE AT THE TIME OF SERVICE. Cash, Visa or MasterCard. Sliding scale - $30 minimum per service. Best way to get seen: Come in to office, complete packet and make an appointment - need proof of income or support monies for each household member and proof of [place] residence. Usually takes about a month to get in.     [facility] [phone]   Location: [address] [facility] Hours: Walk-in  Urgent Care Dental Services are offered Monday-Friday mornings only. The numbers of emergencies accepted daily is limited to the number of providers available. Maximum 15 - Mondays, Wednesdays & Thursdays Maximum 10 - Tuesdays & Fridays Services: You do not need to be a [facility] resident to be seen for a dental emergency. Emergencies are defined as pain, swelling, abnormal bleeding, or dental trauma. Walkins will receive x-rays if needed. NOTE: Dental cleaning is not an emergency. Payment Options: PAYMENT IS DUE AT THE TIME OF SERVICE. Minimum co-pay is $40.00 for uninsured patients. Minimum co-pay is $3.00 for Medicaid with dental coverage. Dental Insurance is accepted and must be presented at time of visit. Medicare does not cover dental. Forms of payment: Cash, credit card, checks. Best way to get seen: If not previously registered with the clinic, walk-in dental registration begins at 7:15 am and is on a first come/first serve basis. If previously registered with the clinic, call to make an appointment.     [facility] [place] ONLY   Location: [address] [place], [place] Clinic Hours: Mon-Thu 10a-2p Services: Extractions only! Payment Options: FREE (donations accepted) - bring proof of income or support Best way to get seen: Call and schedule an appointment OR come at 8am on the 1st Monday of every month (except for holidays) when it is first come/first served.     [facility] [phone]   Location: [place], [place] Clinic Hours: Friday mornings Services, Payment Options, Best way to get seen: Call for info

## 2015-11-02 NOTE — ED Triage Notes (Signed)
Pt presents to ED with c/o abscess tooth right lower jaw. Swelling noted to right side of face. Pt reports pain began last night but woke up today with swelling. Pt denies trouble breathing.

## 2016-03-01 ENCOUNTER — Encounter: Payer: Self-pay | Admitting: Emergency Medicine

## 2016-03-01 ENCOUNTER — Emergency Department
Admission: EM | Admit: 2016-03-01 | Discharge: 2016-03-01 | Disposition: A | Discharge status: Home / Self Care | Payer: Self-pay | Attending: Emergency Medicine | Admitting: Emergency Medicine

## 2016-03-01 DIAGNOSIS — Y929 Unspecified place or not applicable: Secondary | ICD-10-CM | POA: Insufficient documentation

## 2016-03-01 DIAGNOSIS — Y999 Unspecified external cause status: Secondary | ICD-10-CM | POA: Insufficient documentation

## 2016-03-01 DIAGNOSIS — S39012A Strain of muscle, fascia and tendon of lower back, initial encounter: Secondary | ICD-10-CM

## 2016-03-01 DIAGNOSIS — N3090 Cystitis, unspecified without hematuria: Secondary | ICD-10-CM

## 2016-03-01 DIAGNOSIS — B9689 Other specified bacterial agents as the cause of diseases classified elsewhere: Secondary | ICD-10-CM

## 2016-03-01 DIAGNOSIS — Z79899 Other long term (current) drug therapy: Secondary | ICD-10-CM | POA: Insufficient documentation

## 2016-03-01 DIAGNOSIS — Y939 Activity, unspecified: Secondary | ICD-10-CM | POA: Insufficient documentation

## 2016-03-01 DIAGNOSIS — X58XXXA Exposure to other specified factors, initial encounter: Secondary | ICD-10-CM | POA: Insufficient documentation

## 2016-03-01 DIAGNOSIS — N730 Acute parametritis and pelvic cellulitis: Secondary | ICD-10-CM

## 2016-03-01 DIAGNOSIS — F17210 Nicotine dependence, cigarettes, uncomplicated: Secondary | ICD-10-CM | POA: Insufficient documentation

## 2016-03-01 DIAGNOSIS — N760 Acute vaginitis: Secondary | ICD-10-CM | POA: Insufficient documentation

## 2016-03-01 LAB — URINALYSIS, ROUTINE W REFLEX MICROSCOPIC
Bilirubin Urine: NEGATIVE
Glucose, UA: NEGATIVE mg/dL
Ketones, ur: NEGATIVE mg/dL
Nitrite: POSITIVE — AB
PROTEIN: 100 mg/dL — AB
SPECIFIC GRAVITY, URINE: 1.02 (ref 1.005–1.030)
pH: 6 (ref 5.0–8.0)

## 2016-03-01 LAB — WET PREP, GENITAL
SPERM: NONE SEEN
TRICH WET PREP: NONE SEEN
YEAST WET PREP: NONE SEEN

## 2016-03-01 LAB — CHLAMYDIA/NGC RT PCR (ARMC ONLY)
CHLAMYDIA TR: NOT DETECTED
N gonorrhoeae: NOT DETECTED

## 2016-03-01 LAB — PREGNANCY, URINE: Preg Test, Ur: NEGATIVE

## 2016-03-01 MED ORDER — METRONIDAZOLE 500 MG PO TABS: 500.0000 mg | ORAL_TABLET | Freq: Two times a day (BID) | ORAL | 0 refills | Status: DC

## 2016-03-01 MED ORDER — METOCLOPRAMIDE HCL 10 MG PO TABS: 10.0000 mg | ORAL_TABLET | Freq: Four times a day (QID) | ORAL | 0 refills | Status: DC | PRN

## 2016-03-01 MED ORDER — LEVOFLOXACIN 500 MG PO TABS
500.0000 mg | ORAL_TABLET | Freq: Once | ORAL | Status: AC
  Administered 2016-03-01: 500 mg via ORAL
  Filled 2016-03-01: qty 1

## 2016-03-01 MED ORDER — NAPROXEN 500 MG PO TABS: 500.0000 mg | ORAL_TABLET | Freq: Two times a day (BID) | ORAL | 0 refills | Status: DC

## 2016-03-01 MED ORDER — LEVOFLOXACIN 500 MG PO TABS: 500.0000 mg | ORAL_TABLET | Freq: Every day | ORAL | 0 refills | Status: AC

## 2016-03-01 MED ORDER — AZITHROMYCIN 500 MG PO TABS
2000.0000 mg | ORAL_TABLET | Freq: Once | ORAL | Status: AC
  Administered 2016-03-01: 2000 mg via ORAL
  Filled 2016-03-01: qty 4

## 2016-03-01 MED ORDER — DOXYCYCLINE HYCLATE 100 MG PO TABS
100.0000 mg | ORAL_TABLET | Freq: Once | ORAL | Status: AC
  Administered 2016-03-01: 100 mg via ORAL
  Filled 2016-03-01: qty 1

## 2016-03-01 MED ORDER — DOXYCYCLINE HYCLATE 100 MG PO CAPS: 100.0000 mg | ORAL_CAPSULE | Freq: Two times a day (BID) | ORAL | 0 refills | Status: DC

## 2016-03-01 MED ORDER — ONDANSETRON 4 MG PO TBDP
8.0000 mg | ORAL_TABLET | Freq: Once | ORAL | Status: AC
  Administered 2016-03-01: 8 mg via ORAL
  Filled 2016-03-01: qty 2

## 2016-03-01 MED ORDER — KETOROLAC TROMETHAMINE 60 MG/2ML IM SOLN
15.0000 mg | Freq: Once | INTRAMUSCULAR | Status: AC
Start: 2016-03-01 — End: 2016-03-01
  Administered 2016-03-01: 15 mg via INTRAMUSCULAR
  Filled 2016-03-01: qty 2

## 2016-03-01 NOTE — ED Triage Notes (Signed)
Patient ambulatory to triage with steady gait, without difficulty or distress noted; pt reports lower back pain x 3 days with no accomp symptoms; denies any known injury

## 2016-03-01 NOTE — ED Provider Notes (Signed)
[facility] Emergency Department Provider Note  ____________________________________________  Time seen: Approximately 8:37 AM  I have reviewed the triage vital signs and the nursing notes.   HISTORY  Chief Complaint Back Pain    HPI Andrea Mcknight is a 30 y.o. female who complains of low back pain radiating into "all my legs" for the past 3 days. Reports it as sudden onset, severe, unremitting. No fever nausea vomiting chest pain or shortness of breath. No prior surgeries. No recent falls trips or slips. No history of back injury. No history of IV drug use.  She is concerned she could have an STI because her boyfriend recently was diagnosed with 1. She states that "there is no telling" how long he has had the infection.   History reviewed. No pertinent past medical history.   There are no active problems to display for this patient.    History reviewed. No pertinent surgical history.   Prior to Admission medications   Medication Sig Start Date End Date Taking? Authorizing Provider  clindamycin (CLEOCIN) 150 MG capsule Take 1 capsule (150 mg total) by mouth 4 (four) times daily. 11/02/15   Joni Reining, PA-C  doxycycline (VIBRAMYCIN) 100 MG capsule Take 1 capsule (100 mg total) by mouth 2 (two) times daily. 03/01/16   Sharman Cheek, MD  ibuprofen (ADVIL,MOTRIN) 600 MG tablet Take 1 tablet (600 mg total) by mouth every 6 (six) hours as needed for moderate pain. 11/02/14   Gayla Doss, MD  levofloxacin (LEVAQUIN) 500 MG tablet Take 1 tablet (500 mg total) by mouth daily. 03/01/16 03/11/16  Sharman Cheek, MD  metoCLOPramide (REGLAN) 10 MG tablet Take 1 tablet (10 mg total) by mouth every 6 (six) hours as needed. 03/01/16   Sharman Cheek, MD  metroNIDAZOLE (FLAGYL) 500 MG tablet Take 1 tablet (500 mg total) by mouth 2 (two) times daily. 03/01/16   Sharman Cheek, MD  naproxen (NAPROSYN) 500 MG tablet Take 1 tablet (500 mg total) by mouth 2 (two)  times daily with a meal. 03/01/16   Sharman Cheek, MD  oxyCODONE-acetaminophen (PERCOCET) 7.5-325 MG tablet Take 1 tablet by mouth every 4 (four) hours as needed for severe pain. 11/02/15   Joni Reining, PA-C  traMADol (ULTRAM) 50 MG tablet Take 1 tablet (50 mg total) by mouth every 6 (six) hours as needed. 09/20/15   Chinita Pester, FNP     Allergies Penicillins and Tramadol Penicillin causes throat swelling  No family history on file.  Social History Social History  Substance Use Topics  . Smoking status: Current Every Day Smoker    Packs/day: 1.00    Types: Cigarettes  . Smokeless tobacco: Never Used  . Alcohol use No    Review of Systems  Constitutional:   No fever or chills.  ENT:   No sore throat. No rhinorrhea. Cardiovascular:   No chest pain. Respiratory:   No dyspnea or cough. Gastrointestinal:   Positive lower abdominal pain without vomiting and diarrhea. Normal BM yesterday Genitourinary:   Negative for dysuria or difficulty urinating. Denies vaginal discharge or bleeding Musculoskeletal:   Positive lower back pain Neurological:   Negative for headaches. No saddle anesthesia, lower extremity weakness, or paresthesias. No bowel or bladder retention or incontinence. 10-point ROS otherwise negative.  ____________________________________________   PHYSICAL EXAM:  VITAL SIGNS: ED Triage Vitals  Enc Vitals Group     BP --      Pulse --      Resp --  Temp --      Temp Source 03/01/16 0617 Oral     SpO2 --      Weight --      Height --      Head Circumference --      Peak Flow --      Pain Score 03/01/16 0634 10     Pain Loc --      Pain Edu? --      Excl. in GC? --     Vital signs reviewed, nursing assessments reviewed.   Constitutional:   Alert and oriented. Tearful but overall well appearing and not in distress. Eyes:   No scleral icterus. No conjunctival pallor. PERRL. EOMI.  No nystagmus. ENT   Head:   Normocephalic and  atraumatic.   Nose:   No congestion/rhinnorhea. No septal hematoma   Mouth/Throat:   MMM, no pharyngeal erythema. No peritonsillar mass.    Neck:   No stridor. No SubQ emphysema. No meningismus. Hematological/Lymphatic/Immunilogical:   No cervical lymphadenopathy. Cardiovascular:   RRR. Symmetric bilateral radial and DP pulses.  No murmurs.  Respiratory:   Normal respiratory effort without tachypnea nor retractions. Breath sounds are clear and equal bilaterally. No wheezes/rales/rhonchi. Gastrointestinal:   Soft with generalized tenderness. Non distended. There is no CVA tenderness.  No rebound, rigidity, or guarding. Genitourinary:   Pelvic exam performed with nurse Janie at bedside. External exam unremarkable. Significant pain with speculum exam. Friable cervix, copious purulent discharge. On bimanual exam, there is positive CMT, no adnexal tenderness or mass. Uterine manipulation elicits further pain Musculoskeletal:   Nontender with normal range of motion in all extremities. No joint effusions.  No lower extremity tenderness.  No edema. There is bilateral soft tissue tenderness in the paraspinous musculature of the lumbar back., Left greater than right. Worse with movement but patient is able to change position and move around forcefully on the stretcher without difficulty. No focal midline spinal tenderness. Neurologic:   Normal speech and language.  CN 2-10 normal. Motor grossly intact. No gross focal neurologic deficits are appreciated.  Skin:    Skin is warm, dry and intact. No rash noted.  No petechiae, purpura, or bullae.  ____________________________________________    LABS (pertinent positives/negatives) (all labs ordered are listed, but only abnormal results are displayed) Labs Reviewed  WET PREP, GENITAL - Abnormal; Notable for the following:       Result Value   Clue Cells Wet Prep HPF POC PRESENT (*)    WBC, Wet Prep HPF POC MANY (*)    All other components within  normal limits  URINALYSIS, ROUTINE W REFLEX MICROSCOPIC - Abnormal; Notable for the following:    Color, Urine YELLOW (*)    APPearance CLOUDY (*)    Hgb urine dipstick MODERATE (*)    Protein, ur 100 (*)    Nitrite POSITIVE (*)    Leukocytes, UA SMALL (*)    Bacteria, UA MANY (*)    Squamous Epithelial / LPF 0-5 (*)    All other components within normal limits  CHLAMYDIA/NGC RT PCR ([facility] ONLY)  URINE CULTURE  PREGNANCY, URINE   ____________________________________________   EKG    ____________________________________________    RADIOLOGY    ____________________________________________   PROCEDURES Procedures  ____________________________________________   INITIAL IMPRESSION / ASSESSMENT AND PLAN / ED COURSE  Pertinent labs & imaging results that were available during my care of the patient were reviewed by me and considered in my medical decision making (see chart for details).  Patient  presents with reported symptoms most consistent with musculoskeletal low back pain. However, per report of her boyfriend being diagnosed with STI does reveal an exam consistent with PID. This may be related to her low back pain is referred pain as well. No frank peritonitis. No evidence of TOA. Low suspicion for torsion. Pregnancy negative.Considering the patient's symptoms, medical history, and physical examination today, I have low suspicion for cholecystitis or biliary pathology, pancreatitis, perforation or bowel obstruction, hernia, intra-abdominal abscess, AAA or dissection, volvulus or intussusception, mesenteric ischemia, or appendicitis.  Low suspicion for epidural abscess or hematoma or cauda equina syndrome.  Due to reported penicillin allergy I'll treat the patient with high dose azithromycin here, prescriptions for Flagyl, Levaquin, doxycycline. Referral to health department and she does not have a primary care doctor. Return precautions given.  Urinalysis also  consistent with urinary tract infection, nitrite positive. This should be covered by the Levaquin. Urine culture sent. No evidence of pyelonephritis or sepsis.   Clinical Course    ____________________________________________   FINAL CLINICAL IMPRESSION(S) / ED DIAGNOSES  Final diagnoses:  PID (acute pelvic inflammatory disease)  Strain of lumbar region, initial encounter  Bacterial vaginosis Cystitis    New Prescriptions   DOXYCYCLINE (VIBRAMYCIN) 100 MG CAPSULE    Take 1 capsule (100 mg total) by mouth 2 (two) times daily.   LEVOFLOXACIN (LEVAQUIN) 500 MG TABLET    Take 1 tablet (500 mg total) by mouth daily.   METOCLOPRAMIDE (REGLAN) 10 MG TABLET    Take 1 tablet (10 mg total) by mouth every 6 (six) hours as needed.   METRONIDAZOLE (FLAGYL) 500 MG TABLET    Take 1 tablet (500 mg total) by mouth 2 (two) times daily.   NAPROXEN (NAPROSYN) 500 MG TABLET    Take 1 tablet (500 mg total) by mouth 2 (two) times daily with a meal.     Portions of this note were generated with dragon dictation software. Dictation errors may occur despite best attempts at proofreading.    Sharman CheekPhillip Sharone Picchi, MD 03/01/16 [phone]

## 2016-03-01 NOTE — ED Notes (Signed)
Pt is alert and orient pt restless facial grimacing pt reports lower back pain for 3 days. Pt is concerned has std due to boyfriend having one. Not sure which one.Started with bilateral leg pain then pain moved to lower  back. Pt states only urine frequency. Cough non productive as well. No distress noted.

## 2016-03-03 LAB — URINE CULTURE: Culture: >=100000 — AB

## 2016-11-17 ENCOUNTER — Emergency Department
Admission: EM | Admit: 2016-11-17 | Discharge: 2016-11-17 | Disposition: A | Discharge status: Home / Self Care | Payer: Self-pay | Attending: Emergency Medicine | Admitting: Emergency Medicine

## 2016-11-17 DIAGNOSIS — R079 Chest pain, unspecified: Secondary | ICD-10-CM

## 2016-11-17 DIAGNOSIS — F17210 Nicotine dependence, cigarettes, uncomplicated: Secondary | ICD-10-CM | POA: Insufficient documentation

## 2016-11-17 DIAGNOSIS — F1990 Other psychoactive substance use, unspecified, uncomplicated: Secondary | ICD-10-CM | POA: Insufficient documentation

## 2016-11-17 DIAGNOSIS — F1910 Other psychoactive substance abuse, uncomplicated: Secondary | ICD-10-CM

## 2016-11-17 DIAGNOSIS — Z79899 Other long term (current) drug therapy: Secondary | ICD-10-CM | POA: Insufficient documentation

## 2016-11-17 LAB — COMPREHENSIVE METABOLIC PANEL
ALT: 11 U/L — ABNORMAL LOW (ref 14–54)
ANION GAP: 11 (ref 5–15)
AST: 16 U/L (ref 15–41)
Albumin: 4.1 g/dL (ref 3.5–5.0)
Alkaline Phosphatase: 75 U/L (ref 38–126)
BILIRUBIN TOTAL: 0.4 mg/dL (ref 0.3–1.2)
BUN: 16 mg/dL (ref 6–20)
CHLORIDE: 105 mmol/L (ref 101–111)
CO2: 23 mmol/L (ref 22–32)
Calcium: 10.2 mg/dL (ref 8.9–10.3)
Creatinine, Ser: 0.59 mg/dL (ref 0.44–1.00)
GFR calc Af Amer: >60 mL/min (ref >60)
Glucose, Bld: 72 mg/dL (ref 65–99)
POTASSIUM: 3.4 mmol/L — AB (ref 3.5–5.1)
Sodium: 139 mmol/L (ref 135–145)
TOTAL PROTEIN: 7.1 g/dL (ref 6.5–8.1)

## 2016-11-17 LAB — TROPONIN I

## 2016-11-17 LAB — CBC WITH DIFFERENTIAL/PLATELET
BASOS ABS: 0 10*3/uL (ref 0–0.1)
Basophils Relative: 1 %
Eosinophils Absolute: 0.1 10*3/uL (ref 0–0.7)
Eosinophils Relative: 2 %
HCT: 38.1 % (ref 35.0–47.0)
HEMOGLOBIN: 13.1 g/dL (ref 12.0–16.0)
LYMPHS ABS: 2.1 10*3/uL (ref 1.0–3.6)
LYMPHS PCT: 34 %
MCH: 31.2 pg (ref 26.0–34.0)
MCHC: 34.5 g/dL (ref 32.0–36.0)
MCV: 90.5 fL (ref 80.0–100.0)
Monocytes Absolute: 0.7 10*3/uL (ref 0.2–0.9)
Monocytes Relative: 11 %
NEUTROS ABS: 3.1 10*3/uL (ref 1.4–6.5)
NEUTROS PCT: 52 %
PLATELETS: 330 10*3/uL (ref 150–440)
RBC: 4.21 MIL/uL (ref 3.80–5.20)
RDW: 14.4 % (ref 11.5–14.5)
WBC: 6 10*3/uL (ref 3.6–11.0)

## 2016-11-17 LAB — ETHANOL

## 2016-11-17 LAB — ACETAMINOPHEN LEVEL: Acetaminophen (Tylenol), Serum: <10 ug/mL — ABNORMAL LOW (ref 10–30)

## 2016-11-17 LAB — SALICYLATE LEVEL: Salicylate Lvl: <7 mg/dL (ref 2.8–30.0)

## 2016-11-17 MED ORDER — KETOROLAC TROMETHAMINE 30 MG/ML IJ SOLN
15.0000 mg | Freq: Once | INTRAMUSCULAR | Status: AC
  Administered 2016-11-17: 15 mg via INTRAVENOUS
  Filled 2016-11-17: qty 1

## 2016-11-17 MED ORDER — SODIUM CHLORIDE 0.9 % IV BOLUS (SEPSIS)
1000.0000 mL | Freq: Once | INTRAVENOUS | Status: AC
  Administered 2016-11-17: 1000 mL via INTRAVENOUS

## 2016-11-17 NOTE — ED Triage Notes (Addendum)
Per EMS pt was at home and did meth this morning along with marijuana.  Pts boyfriend called EMS due to increasing chest pain.  Pt is very anxious and expresses chest pain of 20/20.  She raises her voice when expressing her pain.  Pt is shivering and states she does not want to be left alone.

## 2016-11-17 NOTE — ED Notes (Signed)
Pt was ask for Urine sample when pt stated she wanted to leave. Pt was holding pressure on her right AC and this tech noticed IV was lying in the floor. Ask pt is she removed the IV and she stated yes that she is fine and ready to go. MD and CN notified and pt agreed to wait approx. 20 mins until troponin test comes back. Pt is dressed and ready. Pt removed herself from monitor.

## 2016-11-17 NOTE — Discharge Instructions (Signed)
Return to the ER for new or worsening chest pain or other symptoms that concern you.

## 2016-11-17 NOTE — ED Notes (Signed)
Patient is alert, took off leads, states she is unable to void at this time. Patient agreed to leave pulse ox on at this time.

## 2016-11-17 NOTE — ED Notes (Signed)
This writer was informed that patient removed her IV and wanted to leave. Dr. Marisa SeverinSiadecki aware.

## 2016-11-17 NOTE — ED Provider Notes (Signed)
[facility] Emergency Department Provider Note ____________________________________________   First MD Initiated Contact with Patient 11/17/16 1019     (approximate)  I have reviewed the triage vital signs and the nursing notes.   HISTORY  Chief Complaint Anxiety  History of present illness Limited due to intoxication.  HPI Andrea Mcknight is a 31 y.o. female no significant past medical history who presents with chest pain. Per EMS, patient's boyfriend called the ambulance; patient states her sister called. Patient states that she did crystal meth this morning before the pain started. She states she also took part of a "Xanax bar."  Patient denies drinking alcohol or any other drug use today.  She denies shortness of breath. She reports anxiety.  History reviewed. No pertinent past medical history.  There are no active problems to display for this patient.   History reviewed. No pertinent surgical history.  Prior to Admission medications   Medication Sig Start Date End Date Taking? Authorizing Provider  clindamycin (CLEOCIN) 150 MG capsule Take 1 capsule (150 mg total) by mouth 4 (four) times daily. 11/02/15   Joni Reining, PA-C  doxycycline (VIBRAMYCIN) 100 MG capsule Take 1 capsule (100 mg total) by mouth 2 (two) times daily. 03/01/16   Sharman Cheek, MD  ibuprofen (ADVIL,MOTRIN) 600 MG tablet Take 1 tablet (600 mg total) by mouth every 6 (six) hours as needed for moderate pain. 11/02/14   Gayla Doss, MD  metoCLOPramide (REGLAN) 10 MG tablet Take 1 tablet (10 mg total) by mouth every 6 (six) hours as needed. 03/01/16   Sharman Cheek, MD  metroNIDAZOLE (FLAGYL) 500 MG tablet Take 1 tablet (500 mg total) by mouth 2 (two) times daily. 03/01/16   Sharman Cheek, MD  naproxen (NAPROSYN) 500 MG tablet Take 1 tablet (500 mg total) by mouth 2 (two) times daily with a meal. 03/01/16   Sharman Cheek, MD  oxyCODONE-acetaminophen (PERCOCET) 7.5-325  MG tablet Take 1 tablet by mouth every 4 (four) hours as needed for severe pain. 11/02/15   Joni Reining, PA-C  traMADol (ULTRAM) 50 MG tablet Take 1 tablet (50 mg total) by mouth every 6 (six) hours as needed. 09/20/15   Triplett, Rulon Eisenmenger B, FNP    Allergies Penicillins and Tramadol  History reviewed. No pertinent family history.  Social History Social History  Substance Use Topics  . Smoking status: Current Every Day Smoker    Packs/day: 1.00    Types: Cigarettes  . Smokeless tobacco: Never Used  . Alcohol use No    Review of Systems Level V caveat: Unable to obtain ROS due to intoxication.    ____________________________________________   PHYSICAL EXAM:  VITAL SIGNS: ED Triage Vitals  Enc Vitals Group     BP 11/17/16 1009 128/70     Pulse Rate 11/17/16 1009 92     Resp 11/17/16 1009 (!) 22     Temp 11/17/16 1009 97.6 F (36.4 C)     Temp Source 11/17/16 1009 Oral     SpO2 11/17/16 1009 100 %     Weight --      Height 11/17/16 1010  (1.651 m)     Head Circumference --      Peak Flow --      Pain Score 11/17/16 1009 10     Pain Loc --      Pain Edu? --      Excl. in GC? --     Constitutional: Slightly somnolent but arousable to  voice and answering some questions. Eyes: Conjunctivae are normal.  Pupils dilated, reactive.  EOMI.  Head: Atraumatic. Nose: No congestion/rhinnorhea. Mouth/Throat: Mucous membranes are slightly dry.  Neck: Normal range of motion.  Cardiovascular: Tachycardic, regular rhythm. Grossly normal heart sounds.  Good peripheral circulation. Respiratory: Normal respiratory effort.  No retractions. Lungs CTAB. Gastrointestinal: Soft and nontender. No distention.  Genitourinary: No CVA tenderness. Musculoskeletal:  Extremities warm and well perfused.  Neurologic:  Motor intact in all extremities.    Skin:  Skin is warm and dry. No rash noted. Psychiatric: Pt is somewhat somnolent, but when aroused appears anxious.    ____________________________________________   LABS (all labs ordered are listed, but only abnormal results are displayed)  Labs Reviewed  COMPREHENSIVE METABOLIC PANEL - Abnormal; Notable for the following:       Result Value   Potassium 3.4 (*)    ALT 11 (*)    All other components within normal limits  ACETAMINOPHEN LEVEL - Abnormal; Notable for the following:    Acetaminophen (Tylenol), Serum <10 (*)    All other components within normal limits  CBC WITH DIFFERENTIAL/PLATELET  SALICYLATE LEVEL  TROPONIN I  ETHANOL  URINE DRUG SCREEN, QUALITATIVE ([facility] ONLY)   ____________________________________________  EKG  ED ECG REPORT I, Dionne Bucy, the attending physician, personally viewed and interpreted this ECG.  Date: 11/17/2016 EKG Time: 1012 Rate: 89 Rhythm: normal sinus rhythm QRS Axis: normal Intervals: normal ST/T Wave abnormalities: normal Narrative Interpretation: no evidence of acute ischemia  ____________________________________________  RADIOLOGY    ____________________________________________   PROCEDURES  Procedure(s) performed: No    Critical Care performed: No ____________________________________________   INITIAL IMPRESSION / ASSESSMENT AND PLAN / ED COURSE  Pertinent labs & imaging results that were available during my care of the patient were reviewed by me and considered in my medical decision making (see chart for details).  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 Patient initially on arrival was somewhat agitated, although at the time of my exam she was slightly somnolent. Vital signs are normal, neuro exam is nonfocal, there is no evidence of trauma, and there are no other notable exam findings. Presentation most likely due to side effects from stimulant use, versus musculoskeletal chest pain in this young patient with no ACS risk factors. EKG is normal. No clinical evidence  to suspect DVT or PE. Since patient apparently took benzos herself and is now calm, I will not give additional benzos at this time. Plan for fluids, basic and tox labs, troponin 1, and observe for sobriety. At this time no evidence of acute danger to self or others; will reassess for any psychiatric symptoms when patient more alert and able to have a conversation.    ----------------------------------------- 11:58 AM on 11/17/2016 -----------------------------------------  Patient is awake and oriented 3, and answering all questions appropriately. VS stable - her BP is borderline low but she is thin and has been sleeping so this is likely close to her baseline.  Appears well perfused clinically and is not lightheaded.  She states she feels much better, denies any current chest pain, and states she would like to go home.  She denies SI or HI, or any other acute psych sx.  lab workup is unremarkable. At this time there is no evidence of danger to self or others or indication to further observe patient in the emergency department.  ____________________________________________   FINAL CLINICAL IMPRESSION(S) / ED DIAGNOSES  Final diagnoses:  Substance abuse  Chest pain, unspecified type      NEW MEDICATIONS STARTED DURING THIS VISIT:  Discharge Medication List as of 11/17/2016 12:02 PM       Note:  This document was prepared using Dragon voice recognition software and may include unintentional dictation errors.    Dionne BucySiadecki, Connie Hilgert, MD 11/17/16 1536

## 2016-11-17 NOTE — ED Notes (Signed)
Patient left without discharge papers. Dr. Marisa SeverinSiadecki aware.

## 2017-01-15 ENCOUNTER — Encounter: Payer: Self-pay | Admitting: Emergency Medicine

## 2017-01-15 ENCOUNTER — Emergency Department
Admission: EM | Admit: 2017-01-15 | Discharge: 2017-01-15 | Disposition: A | Discharge status: Home / Self Care | Payer: Self-pay | Attending: Emergency Medicine | Admitting: Emergency Medicine

## 2017-01-15 DIAGNOSIS — R109 Unspecified abdominal pain: Secondary | ICD-10-CM | POA: Insufficient documentation

## 2017-01-15 DIAGNOSIS — Z79899 Other long term (current) drug therapy: Secondary | ICD-10-CM | POA: Insufficient documentation

## 2017-01-15 DIAGNOSIS — Z885 Allergy status to narcotic agent status: Secondary | ICD-10-CM | POA: Insufficient documentation

## 2017-01-15 DIAGNOSIS — N390 Urinary tract infection, site not specified: Secondary | ICD-10-CM | POA: Insufficient documentation

## 2017-01-15 DIAGNOSIS — N938 Other specified abnormal uterine and vaginal bleeding: Secondary | ICD-10-CM | POA: Insufficient documentation

## 2017-01-15 DIAGNOSIS — Z880 Allergy status to penicillin: Secondary | ICD-10-CM | POA: Insufficient documentation

## 2017-01-15 DIAGNOSIS — N946 Dysmenorrhea, unspecified: Secondary | ICD-10-CM | POA: Insufficient documentation

## 2017-01-15 DIAGNOSIS — R300 Dysuria: Secondary | ICD-10-CM | POA: Insufficient documentation

## 2017-01-15 DIAGNOSIS — N12 Tubulo-interstitial nephritis, not specified as acute or chronic: Secondary | ICD-10-CM | POA: Insufficient documentation

## 2017-01-15 DIAGNOSIS — R350 Frequency of micturition: Secondary | ICD-10-CM | POA: Insufficient documentation

## 2017-01-15 DIAGNOSIS — F17210 Nicotine dependence, cigarettes, uncomplicated: Secondary | ICD-10-CM | POA: Insufficient documentation

## 2017-01-15 DIAGNOSIS — Z3202 Encounter for pregnancy test, result negative: Secondary | ICD-10-CM | POA: Insufficient documentation

## 2017-01-15 LAB — CBC
HEMATOCRIT: 38.3 % (ref 35.0–47.0)
Hemoglobin: 13.1 g/dL (ref 12.0–16.0)
MCH: 31.1 pg (ref 26.0–34.0)
MCHC: 34.1 g/dL (ref 32.0–36.0)
MCV: 91.2 fL (ref 80.0–100.0)
PLATELETS: 324 10*3/uL (ref 150–440)
RBC: 4.2 MIL/uL (ref 3.80–5.20)
RDW: 13.8 % (ref 11.5–14.5)
WBC: 8 10*3/uL (ref 3.6–11.0)

## 2017-01-15 LAB — COMPREHENSIVE METABOLIC PANEL
ALBUMIN: 4 g/dL (ref 3.5–5.0)
ALT: 12 U/L — AB (ref 14–54)
AST: 21 U/L (ref 15–41)
Alkaline Phosphatase: 74 U/L (ref 38–126)
Anion gap: 6 (ref 5–15)
BUN: 19 mg/dL (ref 6–20)
CHLORIDE: 107 mmol/L (ref 101–111)
CO2: 23 mmol/L (ref 22–32)
CREATININE: 0.66 mg/dL (ref 0.44–1.00)
Calcium: 8.7 mg/dL — ABNORMAL LOW (ref 8.9–10.3)
GFR calc Af Amer: >60 mL/min (ref >60)
GLUCOSE: 83 mg/dL (ref 65–99)
Potassium: 3.9 mmol/L (ref 3.5–5.1)
SODIUM: 136 mmol/L (ref 135–145)
Total Bilirubin: 0.6 mg/dL (ref 0.3–1.2)
Total Protein: 7.3 g/dL (ref 6.5–8.1)

## 2017-01-15 LAB — URINALYSIS, COMPLETE (UACMP) WITH MICROSCOPIC
Bilirubin Urine: NEGATIVE
Glucose, UA: NEGATIVE mg/dL
KETONES UR: NEGATIVE mg/dL
Leukocytes, UA: NEGATIVE
Nitrite: POSITIVE — AB
PH: 7 (ref 5.0–8.0)
Protein, ur: 30 mg/dL — AB
SPECIFIC GRAVITY, URINE: 1.023 (ref 1.005–1.030)

## 2017-01-15 LAB — POCT PREGNANCY, URINE: PREG TEST UR: NEGATIVE

## 2017-01-15 LAB — LIPASE, BLOOD: LIPASE: 21 U/L (ref 11–51)

## 2017-01-15 LAB — HCG, QUANTITATIVE, PREGNANCY

## 2017-01-15 MED ORDER — ONDANSETRON HCL 4 MG/2ML IJ SOLN
4.0000 mg | Freq: Once | INTRAMUSCULAR | Status: AC
  Administered 2017-01-15: 4 mg via INTRAVENOUS
  Filled 2017-01-15: qty 2

## 2017-01-15 MED ORDER — ONDANSETRON 4 MG PO TBDP: 4.0000 mg | ORAL_TABLET | Freq: Three times a day (TID) | ORAL | 0 refills | Status: DC | PRN

## 2017-01-15 MED ORDER — NITROFURANTOIN MONOHYD MACRO 100 MG PO CAPS
ORAL_CAPSULE | ORAL | Status: AC
  Filled 2017-01-15: qty 1

## 2017-01-15 MED ORDER — KETOROLAC TROMETHAMINE 30 MG/ML IJ SOLN
15.0000 mg | Freq: Once | INTRAMUSCULAR | Status: AC
  Administered 2017-01-15: 15 mg via INTRAVENOUS
  Filled 2017-01-15: qty 1

## 2017-01-15 MED ORDER — NITROFURANTOIN MONOHYD MACRO 100 MG PO CAPS: 100.0000 mg | ORAL_CAPSULE | Freq: Once | ORAL | Status: DC

## 2017-01-15 MED ORDER — CEFTRIAXONE SODIUM IN DEXTROSE 20 MG/ML IV SOLN
INTRAVENOUS | Status: AC
  Administered 2017-01-15: 1000 mg
  Filled 2017-01-15: qty 50

## 2017-01-15 MED ORDER — CEPHALEXIN 500 MG PO CAPS: 500.0000 mg | ORAL_CAPSULE | Freq: Four times a day (QID) | ORAL | 0 refills | Status: AC

## 2017-01-15 MED ORDER — DEXTROSE 5 % IV SOLN
1.0000 g | Freq: Once | INTRAVENOUS | Status: DC
  Filled 2017-01-15: qty 10

## 2017-01-15 MED ORDER — IBUPROFEN 400 MG PO TABS: 600.0000 mg | ORAL_TABLET | Freq: Once | ORAL | Status: DC

## 2017-01-15 MED ORDER — OXYCODONE-ACETAMINOPHEN 5-325 MG PO TABS
1.0000 | ORAL_TABLET | ORAL | Status: DC | PRN
  Administered 2017-01-15: 1 via ORAL
  Filled 2017-01-15: qty 1

## 2017-01-15 MED ORDER — IBUPROFEN 600 MG PO TABS
ORAL_TABLET | ORAL | Status: AC
  Filled 2017-01-15: qty 1

## 2017-01-15 NOTE — ED Triage Notes (Signed)
Patient presents to the ED with lower abdominal pain and cramping and vaginal bleeding.  Patient reports bleeding began today, starting with spotting and then getting heavier.  Patient reports having a positive pregnancy test 5 days ago.  Patient appears very uncomfortable in triage.

## 2017-01-15 NOTE — ED Notes (Signed)
Ems pt to lobby via wheelchair , lower abd pian with spotting , took preg test x5 days ago + , 20 gauge  Left AC

## 2017-01-15 NOTE — ED Provider Notes (Signed)
[facility] Emergency Department Provider Note  ____________________________________________  Time seen: Approximately 3:38 PM  I have reviewed the triage vital signs and the nursing notes.   HISTORY  Chief Complaint Pelvic Pain and Vaginal Bleeding   HPI Andrea Mcknight is a 31 y.o. female no significant past medical history who presents for evaluation of abdominal pain and vaginal bleeding. Patient reports her last menstrual period was in the end of September. She took 5 pregnancy tests at home and they're all positive. Today she started having vaginal bleeding. She is also complaining of pressure in the suprapubic region associated with dysuria and frequency which has been ongoing for a month. Today she is also having pain in her bilateral flanks. No nausea, no vomiting, no fever, no chills, no vaginal discharge.  History reviewed. No pertinent past medical history.  There are no active problems to display for this patient.   History reviewed. No pertinent surgical history.  Prior to Admission medications   Medication Sig Start Date End Date Taking? Authorizing Provider  cephALEXin (KEFLEX) 500 MG capsule Take 1 capsule (500 mg total) 4 (four) times daily for 10 days by mouth. 01/15/17 01/25/17  Nita Sickle, MD  clindamycin (CLEOCIN) 150 MG capsule Take 1 capsule (150 mg total) by mouth 4 (four) times daily. 11/02/15   Joni Reining, PA-C  doxycycline (VIBRAMYCIN) 100 MG capsule Take 1 capsule (100 mg total) by mouth 2 (two) times daily. 03/01/16   Sharman Cheek, MD  ibuprofen (ADVIL,MOTRIN) 600 MG tablet Take 1 tablet (600 mg total) by mouth every 6 (six) hours as needed for moderate pain. 11/02/14   Gayla Doss, MD  metoCLOPramide (REGLAN) 10 MG tablet Take 1 tablet (10 mg total) by mouth every 6 (six) hours as needed. 03/01/16   Sharman Cheek, MD  metroNIDAZOLE (FLAGYL) 500 MG tablet Take 1 tablet (500 mg total) by mouth 2 (two) times  daily. 03/01/16   Sharman Cheek, MD  naproxen (NAPROSYN) 500 MG tablet Take 1 tablet (500 mg total) by mouth 2 (two) times daily with a meal. 03/01/16   Sharman Cheek, MD  ondansetron (ZOFRAN ODT) 4 MG disintegrating tablet Take 1 tablet (4 mg total) every 8 (eight) hours as needed by mouth for nausea or vomiting. 01/15/17   Nita Sickle, MD  oxyCODONE-acetaminophen (PERCOCET) 7.5-325 MG tablet Take 1 tablet by mouth every 4 (four) hours as needed for severe pain. 11/02/15   Joni Reining, PA-C  traMADol (ULTRAM) 50 MG tablet Take 1 tablet (50 mg total) by mouth every 6 (six) hours as needed. 09/20/15   Triplett, Rulon Eisenmenger B, FNP    Allergies Penicillins and Tramadol  No family history on file.  Social History Social History   Tobacco Use  . Smoking status: Current Every Day Smoker    Packs/day: 1.00    Types: Cigarettes  . Smokeless tobacco: Never Used  Substance Use Topics  . Alcohol use: No  . Drug use: No    Review of Systems  Constitutional: Negative for fever. Eyes: Negative for visual changes. ENT: Negative for sore throat. Neck: No neck pain  Cardiovascular: Negative for chest pain. Respiratory: Negative for shortness of breath. Gastrointestinal: + suprapubic pressure. No vomiting or diarrhea. Genitourinary: + dysuria and vaginal bleeding Musculoskeletal: Negative for back pain. Skin: Negative for rash. Neurological: Negative for headaches, weakness or numbness. Psych: No SI or HI  ____________________________________________   PHYSICAL EXAM:  VITAL SIGNS: ED Triage Vitals  Enc Vitals Group  BP 01/15/17 1328 106/68     Pulse Rate 01/15/17 1328 72     Resp 01/15/17 1328 18     Temp 01/15/17 1328 98.2 F (36.8 C)     Temp Source 01/15/17 1328 Oral     SpO2 01/15/17 1328 98 %     Weight 01/15/17 1155 100 lb (45.4 kg)     Height 01/15/17 1155 5\' 4"  (1.626 m)     Head Circumference --      Peak Flow --      Pain Score 01/15/17 1155 10     Pain  Loc --      Pain Edu? --      Excl. in GC? --     Constitutional: Alert and oriented. Well appearing and in no apparent distress. HEENT:      Head: Normocephalic and atraumatic.         Eyes: Conjunctivae are normal. Sclera is non-icteric.       Mouth/Throat: Mucous membranes are moist.       Neck: Supple with no signs of meningismus. Cardiovascular: Regular rate and rhythm. No murmurs, gallops, or rubs. 2+ symmetrical distal pulses are present in all extremities. No JVD. Respiratory: Normal respiratory effort. Lungs are clear to auscultation bilaterally. No wheezes, crackles, or rhonchi.  Gastrointestinal: Soft, mild suprapubic ttp, and non distended with positive bowel sounds. No rebound or guarding. Genitourinary: No CVA tenderness. Musculoskeletal: Nontender with normal range of motion in all extremities. No edema, cyanosis, or erythema of extremities. Neurologic: Normal speech and language. Face is symmetric. Moving all extremities. No gross focal neurologic deficits are appreciated. Skin: Skin is warm, dry and intact. No rash noted. Psychiatric: Mood and affect are normal. Speech and behavior are normal.  ____________________________________________   LABS (all labs ordered are listed, but only abnormal results are displayed)  Labs Reviewed  COMPREHENSIVE METABOLIC PANEL - Abnormal; Notable for the following components:      Result Value   Calcium 8.7 (*)    ALT 12 (*)    All other components within normal limits  URINALYSIS, COMPLETE (UACMP) WITH MICROSCOPIC - Abnormal; Notable for the following components:   Color, Urine YELLOW (*)    APPearance HAZY (*)    Hgb urine dipstick LARGE (*)    Protein, ur 30 (*)    Nitrite POSITIVE (*)    Bacteria, UA MANY (*)    Squamous Epithelial / LPF 0-5 (*)    All other components within normal limits  URINE CULTURE  LIPASE, BLOOD  CBC  HCG, QUANTITATIVE, PREGNANCY  POC URINE PREG, ED  POCT PREGNANCY, URINE    ____________________________________________  EKG  none  ____________________________________________  RADIOLOGY  none  ____________________________________________   PROCEDURES  Procedure(s) performed: None Procedures Critical Care performed:  None ____________________________________________   INITIAL IMPRESSION / ASSESSMENT AND PLAN / ED COURSE   31 y.o. female no significant past medical history who presents for evaluation of abdominal pain and vaginal bleeding. Patient concerned she might be pregnant with a possible miscarriage since her last menstrual period was in the end of September and she had 5 positive pregnancy test at home. Her hCG and urine pregnancy here are both negative therefore her pain and vaginal bleeding are most likely from menstrual cycle and cramps. She is complaining of dysuria and bilateral flank discomfort which started today. He UA is positive for urinary tract infection. She has no fever, no tachycardia, and no leukocytosis with no signs of sepsis. Possibly early pyelonephritis. We'll  give a dose of Toradol, Rocephin, Zofran, and discharged home on Keflex      As part of my medical decision making, I reviewed the following data within the electronic MEDICAL RECORD NUMBER Nursing notes reviewed and incorporated, Labs reviewed , Notes from prior ED visits and [place] Controlled Substance Database    Pertinent labs & imaging results that were available during my care of the patient were reviewed by me and considered in my medical decision making (see chart for details).    ____________________________________________   FINAL CLINICAL IMPRESSION(S) / ED DIAGNOSES  Final diagnoses:  Menstrual cramps  Pyelonephritis      NEW MEDICATIONS STARTED DURING THIS VISIT:  This SmartLink is deprecated. Use AVSMEDLIST instead to display the medication list for a patient.   Note:  This document was prepared using Dragon voice recognition software and may  include unintentional dictation errors.    Nita SickleVeronese, [place], MD 01/15/17 [phone]

## 2017-01-18 LAB — URINE CULTURE

## 2019-03-20 ENCOUNTER — Emergency Department: Payer: Medicaid Other

## 2019-03-20 ENCOUNTER — Emergency Department
Admission: EM | Admit: 2019-03-20 | Discharge: 2019-03-20 | Disposition: A | Discharge status: Home / Self Care | Payer: Medicaid Other | Attending: Emergency Medicine | Admitting: Emergency Medicine

## 2019-03-20 DIAGNOSIS — Z3202 Encounter for pregnancy test, result negative: Secondary | ICD-10-CM | POA: Insufficient documentation

## 2019-03-20 DIAGNOSIS — N12 Tubulo-interstitial nephritis, not specified as acute or chronic: Secondary | ICD-10-CM | POA: Diagnosis not present

## 2019-03-20 DIAGNOSIS — Z79899 Other long term (current) drug therapy: Secondary | ICD-10-CM | POA: Diagnosis not present

## 2019-03-20 DIAGNOSIS — Z20822 Contact with and (suspected) exposure to covid-19: Secondary | ICD-10-CM | POA: Diagnosis not present

## 2019-03-20 DIAGNOSIS — R102 Pelvic and perineal pain: Secondary | ICD-10-CM

## 2019-03-20 DIAGNOSIS — F17210 Nicotine dependence, cigarettes, uncomplicated: Secondary | ICD-10-CM | POA: Diagnosis not present

## 2019-03-20 DIAGNOSIS — R52 Pain, unspecified: Secondary | ICD-10-CM

## 2019-03-20 DIAGNOSIS — R0602 Shortness of breath: Secondary | ICD-10-CM | POA: Insufficient documentation

## 2019-03-20 DIAGNOSIS — Z8742 Personal history of other diseases of the female genital tract: Secondary | ICD-10-CM

## 2019-03-20 DIAGNOSIS — R509 Fever, unspecified: Secondary | ICD-10-CM | POA: Diagnosis present

## 2019-03-20 DIAGNOSIS — N739 Female pelvic inflammatory disease, unspecified: Secondary | ICD-10-CM | POA: Diagnosis not present

## 2019-03-20 HISTORY — DX: Other psychoactive substance abuse, in remission: F19.11

## 2019-03-20 HISTORY — DX: Female pelvic inflammatory disease, unspecified: N73.9

## 2019-03-20 LAB — URINALYSIS, COMPLETE (UACMP) WITH MICROSCOPIC
Bilirubin Urine: NEGATIVE
Glucose, UA: NEGATIVE mg/dL
Ketones, ur: NEGATIVE mg/dL
Nitrite: POSITIVE — AB
Protein, ur: 100 mg/dL — AB
Specific Gravity, Urine: 1.019 (ref 1.005–1.030)
pH: 6 (ref 5.0–8.0)

## 2019-03-20 LAB — CBC WITH DIFFERENTIAL/PLATELET
Abs Immature Granulocytes: 0.06 10*3/uL (ref 0.00–0.07)
Basophils Absolute: 0 10*3/uL (ref 0.0–0.1)
Basophils Relative: 0 %
Eosinophils Absolute: 0 10*3/uL (ref 0.0–0.5)
Eosinophils Relative: 0 %
HCT: 37.3 % (ref 36.0–46.0)
Hemoglobin: 12.6 g/dL (ref 12.0–15.0)
Immature Granulocytes: 0 %
Lymphocytes Relative: 13 %
Lymphs Abs: 1.9 10*3/uL (ref 0.7–4.0)
MCH: 29.4 pg (ref 26.0–34.0)
MCHC: 33.8 g/dL (ref 30.0–36.0)
MCV: 86.9 fL (ref 80.0–100.0)
Monocytes Absolute: 1.7 10*3/uL — ABNORMAL HIGH (ref 0.1–1.0)
Monocytes Relative: 12 %
Neutro Abs: 10.7 10*3/uL — ABNORMAL HIGH (ref 1.7–7.7)
Neutrophils Relative %: 75 %
Platelets: 355 10*3/uL (ref 150–400)
RBC: 4.29 MIL/uL (ref 3.87–5.11)
RDW: 12.4 % (ref 11.5–15.5)
WBC: 14.4 10*3/uL — ABNORMAL HIGH (ref 4.0–10.5)
nRBC: 0 % (ref 0.0–0.2)

## 2019-03-20 LAB — COMPREHENSIVE METABOLIC PANEL
ALT: 75 U/L — ABNORMAL HIGH (ref 0–44)
AST: 76 U/L — ABNORMAL HIGH (ref 15–41)
Albumin: 3.7 g/dL (ref 3.5–5.0)
Alkaline Phosphatase: 83 U/L (ref 38–126)
Anion gap: 8 (ref 5–15)
BUN: 12 mg/dL (ref 6–20)
CO2: 23 mmol/L (ref 22–32)
Calcium: 8.7 mg/dL — ABNORMAL LOW (ref 8.9–10.3)
Chloride: 101 mmol/L (ref 98–111)
Creatinine, Ser: 0.7 mg/dL (ref 0.44–1.00)
GFR calc Af Amer: >60 mL/min (ref >60)
GFR calc non Af Amer: >60 mL/min (ref >60)
Glucose, Bld: 102 mg/dL — ABNORMAL HIGH (ref 70–99)
Potassium: 3.6 mmol/L (ref 3.5–5.1)
Sodium: 132 mmol/L — ABNORMAL LOW (ref 135–145)
Total Bilirubin: 0.4 mg/dL (ref 0.3–1.2)
Total Protein: 7.7 g/dL (ref 6.5–8.1)

## 2019-03-20 LAB — POC SARS CORONAVIRUS 2 AG: SARS Coronavirus 2 Ag: NEGATIVE

## 2019-03-20 LAB — POCT PREGNANCY, URINE: Preg Test, Ur: NEGATIVE

## 2019-03-20 LAB — PROCALCITONIN: Procalcitonin: 0.1 ng/mL

## 2019-03-20 LAB — RESPIRATORY PANEL BY RT PCR (FLU A&B, COVID)
Influenza A by PCR: NEGATIVE
Influenza B by PCR: NEGATIVE
SARS Coronavirus 2 by RT PCR: NEGATIVE

## 2019-03-20 LAB — PROTIME-INR
INR: 1.1 (ref 0.8–1.2)
Prothrombin Time: 14 seconds (ref 11.4–15.2)

## 2019-03-20 LAB — LACTIC ACID, PLASMA: Lactic Acid, Venous: 1.4 mmol/L (ref 0.5–1.9)

## 2019-03-20 MED ORDER — SODIUM CHLORIDE 0.9 % IV BOLUS: 500.0000 mL | Freq: Once | INTRAVENOUS | Status: DC

## 2019-03-20 MED ORDER — LEVOFLOXACIN 500 MG PO TABS: 500.0000 mg | ORAL_TABLET | Freq: Every day | ORAL | 0 refills | Status: AC

## 2019-03-20 MED ORDER — AZITHROMYCIN 500 MG PO TABS
2000.0000 mg | ORAL_TABLET | ORAL | Status: AC
  Administered 2019-03-20: 06:00:00 2000 mg via ORAL
  Filled 2019-03-20: qty 4

## 2019-03-20 MED ORDER — ONDANSETRON 4 MG PO TBDP: ORAL_TABLET | ORAL | 0 refills | Status: DC

## 2019-03-20 MED ORDER — SODIUM CHLORIDE 0.9 % IV BOLUS (SEPSIS)
1000.0000 mL | Freq: Once | INTRAVENOUS | Status: AC
  Administered 2019-03-20: 1000 mL via INTRAVENOUS

## 2019-03-20 MED ORDER — IOHEXOL 300 MG/ML  SOLN
100.0000 mL | Freq: Once | INTRAMUSCULAR | Status: AC | PRN
  Administered 2019-03-20: 07:00:00 100 mL via INTRAVENOUS

## 2019-03-20 MED ORDER — METRONIDAZOLE 500 MG PO TABS
500.0000 mg | ORAL_TABLET | Freq: Once | ORAL | Status: AC
  Administered 2019-03-20: 06:00:00 500 mg via ORAL
  Filled 2019-03-20: qty 1

## 2019-03-20 MED ORDER — ONDANSETRON HCL 4 MG/2ML IJ SOLN
4.0000 mg | INTRAMUSCULAR | Status: AC
  Administered 2019-03-20: 06:00:00 4 mg via INTRAVENOUS
  Filled 2019-03-20: qty 2

## 2019-03-20 MED ORDER — ACETAMINOPHEN 500 MG PO TABS
1000.0000 mg | ORAL_TABLET | Freq: Once | ORAL | Status: AC
  Administered 2019-03-20: 05:00:00 1000 mg via ORAL
  Filled 2019-03-20: qty 2

## 2019-03-20 MED ORDER — METRONIDAZOLE 500 MG PO TABS: 500.0000 mg | ORAL_TABLET | Freq: Two times a day (BID) | ORAL | 0 refills | Status: AC

## 2019-03-20 MED ORDER — LEVOFLOXACIN 500 MG PO TABS
500.0000 mg | ORAL_TABLET | Freq: Once | ORAL | Status: AC
  Administered 2019-03-20: 06:00:00 500 mg via ORAL
  Filled 2019-03-20: qty 1

## 2019-03-20 MED ORDER — SODIUM CHLORIDE 0.9 % IV BOLUS
1000.0000 mL | Freq: Once | INTRAVENOUS | Status: AC
  Administered 2019-03-20: 05:00:00 1000 mL via INTRAVENOUS

## 2019-03-20 NOTE — Discharge Instructions (Addendum)
As we discussed, you have a urinary tract infection that has spread to kidneys and possibly condition pelvic inflammatory disease.  I offered to admit you to the hospital for treatment for these conditions but you declined.  It is very important that you take the full course of all antibiotics I prescribed.  Try to drink plenty of fluids to stay hydrated and maintain a good diet so your body has energy to get better.  You need REPEAT CT scan in 4 weeks due to concern for  severity of the pyelo! You can do that at primary doc or I gave you number for urologist to follow up with. Call to make appointment.    Use the prescribed Zofran as needed if you continue to be nauseated.  Please return immediately to the emergency department if you are not improving or if you develop any new or worsening symptoms that concern you.  IMPRESSION: 1. Subtle edema around the lower pole right kidney with 2 x 1.5 cm focus of ill-defined hypoenhancement. Features likely reflect pyelonephritis, potentially with early phlegmon. This does not appear to be a well defined intrarenal abscess. As neoplasm could have this appearance, follow-up recommended to ensure resolution. 2. Bilateral areas of focal renal cortical scarring, potentially related to previous infections. 3. Tiny nonobstructing bilateral renal stones. 4. Bilateral symmetric sacroiliitis.

## 2019-03-20 NOTE — ED Notes (Signed)
US at bedside

## 2019-03-20 NOTE — ED Notes (Signed)
Pt given graham crackers and ginger ale for PO challenge °

## 2019-03-20 NOTE — ED Provider Notes (Addendum)
7:19 AM Assumed care for off going team.   Blood pressure (!) 89/61, pulse (!) 109, temperature 99.8 F (37.7 C), temperature source Oral, resp. rate 20, height 5\' 5"  (1.651 m), weight 52.2 kg, SpO2 100 %.  See their HPI for full report but in brief Pending CT---Febrile, tachy- PID x2 in past. Declines pelvic exam. Wanted rx empirically.   8:09 AM patient CT scan is concerning for Pyelo versus early phlegmon unlikely to be a drainable collection. Reevaluated patient and she is still adamant that she does not want to stay in the hospital.  Discussed this new finding of the phlegmon and how this could potentially get worsening from an abscess that would need drainage.  We also discussed getting a dose of IV antibiotics but patient states that she is ready to go home and does not want to stay any longer.  Patient understands that this could get worse and that she may need to return to the ER.  She is already been given a dose of oral antibiotics and states that she has no issues getting to the pharmacy to get the rest of the treatments.  9:05 AM d/w Dr. from urology  This looks like a lobar nephronia or segmental pyelonephritis. If she is taking p.o. would treat with 10-14 days antibiotics with precautions to return for persistent or worsening pain/fever. Would recommend a follow-up CT in 4 weeks. If she does not have a PCP we can see her in follow-up.  Rediscussed with patient and she still adamant that she does not want to stay.  She understands the risk of death or permanent disability.  She is capacity to make this decision.  Patient is able to tolerate p.o and vital signs greatly improved from prior with fluids.  We will give some Zofran for discharge.  Patient understands that she should come back if her symptoms are worsening.  I discussed the provisional nature of ED diagnosis, the treatment so far, the ongoing plan of care, follow up appointments and return precautions with the patient  and any family or support people present. They expressed understanding and agreed with the plan, discharged home.          Lonna Cobb, MD 03/20/19 05/18/19    [id number], MD 03/20/19 [phone]

## 2019-03-20 NOTE — ED Triage Notes (Signed)
Patient c/o fever, SOB, generalized pain beginning at 0100 yesterday. Patient is stuttering - says this is abnormal.

## 2019-03-20 NOTE — ED Notes (Signed)
Provider states PO abx will be sufficient if sepsis tracking contacts this RN.

## 2019-03-20 NOTE — ED Provider Notes (Signed)
[facility] Emergency Department Provider Note  ____________________________________________   First MD Initiated Contact with Patient 03/20/19 [phone]     (approximate)  I have reviewed the triage vital signs and the nursing notes.   HISTORY  Chief Complaint Fever and Shortness of Breath    HPI Andrea Mcknight is a 34 y.o. female with medical history as listed below who presents for evaluation of about 24 hours of a variety of symptoms including fever, chills, nausea, body aches, shortness of breath, and lower abdominal/pelvic pain that is rating to the back.  She denies contact with COVID-19 patients.  She has a mild sore throat but denies loss of smell and taste.  No headache or neck pain/stiffness.  She denies chest pain, cough, and dysuria.  Nothing particular makes his symptoms better or worse and they have been gradually getting worse over the course of the last 24 hours.  She also is stuttering which she says is not normal for her.  She reports being sexually active with her husband and denies excessive vaginal discharge.          Past Medical History:  Diagnosis Date  . History of substance abuse ([place])   . Pelvic inflammatory disease (PID)    at least twice previously    There are no problems to display for this patient.   Past Surgical History:  Procedure Laterality Date  . LAPAROSCOPY     endometriosis    Prior to Admission medications   Medication Sig Start Date End Date Taking? Authorizing Provider  clindamycin (CLEOCIN) 150 MG capsule Take 1 capsule (150 mg total) by mouth 4 (four) times daily. 11/02/15   Sable Feil, PA-C  doxycycline (VIBRAMYCIN) 100 MG capsule Take 1 capsule (100 mg total) by mouth 2 (two) times daily. 03/01/16   Carrie Mew, MD  ibuprofen (ADVIL,MOTRIN) 600 MG tablet Take 1 tablet (600 mg total) by mouth every 6 (six) hours as needed for moderate pain. 11/02/14   Joanne Gavel, MD  levofloxacin  (LEVAQUIN) 500 MG tablet Take 1 tablet (500 mg total) by mouth daily for 14 days. 03/20/19 04/03/19  Hinda Kehr, MD  metoCLOPramide (REGLAN) 10 MG tablet Take 1 tablet (10 mg total) by mouth every 6 (six) hours as needed. 03/01/16   Carrie Mew, MD  metroNIDAZOLE (FLAGYL) 500 MG tablet Take 1 tablet (500 mg total) by mouth 2 (two) times daily for 14 days. 03/20/19 04/03/19  Hinda Kehr, MD  naproxen (NAPROSYN) 500 MG tablet Take 1 tablet (500 mg total) by mouth 2 (two) times daily with a meal. 03/01/16   Carrie Mew, MD  ondansetron (ZOFRAN ODT) 4 MG disintegrating tablet Allow 1-2 tablets to dissolve in your mouth every 8 hours as needed for nausea/vomiting 03/20/19   Hinda Kehr, MD  oxyCODONE-acetaminophen (PERCOCET) 7.5-325 MG tablet Take 1 tablet by mouth every 4 (four) hours as needed for severe pain. 11/02/15   Sable Feil, PA-C  traMADol (ULTRAM) 50 MG tablet Take 1 tablet (50 mg total) by mouth every 6 (six) hours as needed. 09/20/15   Triplett, Johnette Abraham B, FNP    Allergies Penicillins and Tramadol  History reviewed. No pertinent family history.  Social History Social History   Tobacco Use  . Smoking status: Current Every Day Smoker    Packs/day: 1.00    Types: Cigarettes  . Smokeless tobacco: Never Used  Substance Use Topics  . Alcohol use: No  . Drug use: No    Review  of Systems Constitutional: +fever/chills, + malaise/fatigue Eyes: No visual changes. ENT: Mild sore throat.  No loss of smell or taste. Cardiovascular: Denies chest pain. Respiratory: Shortness of breath, no cough Gastrointestinal: Lower abdominal/suprapubic pain with nausea, some aching pain rating to her lower back.   Genitourinary: Negative for dysuria. Musculoskeletal: Generalized muscle aches.  Negative for neck pain.  Negative for back pain. Integumentary: Negative for rash. Neurological: Acute onset stutter.  Negative for headaches, focal weakness or numbness.    ____________________________________________   PHYSICAL EXAM:  VITAL SIGNS: ED Triage Vitals  Enc Vitals Group     BP 03/20/19 0446 (!) 100/51     Pulse Rate 03/20/19 0446 (!) 133     Resp 03/20/19 0446 (!) 28     Temp 03/20/19 0446 (!) 103 F (39.4 C)     Temp src --      SpO2 03/20/19 0446 100 %     Weight 03/20/19 0447 52.2 kg (115 lb)     Height 03/20/19 0447 1.651 m (5\' 5" )     Head Circumference --      Peak Flow --      Pain Score 03/20/19 0447 10     Pain Loc --      Pain Edu? --      Excl. in GC? --     Constitutional: Alert and oriented.  Appears uncomfortable. Eyes: Conjunctivae are normal.  Head: Atraumatic. Nose: No congestion/rhinnorhea. Mouth/Throat: Patient is wearing a mask. Neck: No stridor.  No meningeal signs.   Cardiovascular: Tachycardia, regular rhythm. Good peripheral circulation. Grossly normal heart sounds. Respiratory: Normal respiratory effort.  No retractions. Gastrointestinal: Soft and nondistended.  Tenderness to palpation of the suprapubic region, no other focal tenderness. Genitourinary: Patient declined pelvic exam Musculoskeletal: No lower extremity tenderness nor edema. No gross deformities of extremities. Neurologic: Stuttering speech and normal language. No gross focal neurologic deficits are appreciated.  Skin:  Skin is warm, dry and intact. Psychiatric: Mood and affect are normal. Speech and behavior are normal.  ____________________________________________   LABS (all labs ordered are listed, but only abnormal results are displayed)  Labs Reviewed  COMPREHENSIVE METABOLIC PANEL - Abnormal; Notable for the following components:      Result Value   Sodium 132 (*)    Glucose, Bld 102 (*)    Calcium 8.7 (*)    AST 76 (*)    ALT 75 (*)    All other components within normal limits  CBC WITH DIFFERENTIAL/PLATELET - Abnormal; Notable for the following components:   WBC 14.4 (*)    Neutro Abs 10.7 (*)    Monocytes Absolute 1.7  (*)    All other components within normal limits  URINALYSIS, COMPLETE (UACMP) WITH MICROSCOPIC - Abnormal; Notable for the following components:   Color, Urine YELLOW (*)    APPearance CLOUDY (*)    Hgb urine dipstick MODERATE (*)    Protein, ur 100 (*)    Nitrite POSITIVE (*)    Leukocytes,Ua SMALL (*)    Bacteria, UA MANY (*)    All other components within normal limits  RESPIRATORY PANEL BY RT PCR (FLU A&B, COVID)  CULTURE, BLOOD (ROUTINE X 2)  CULTURE, BLOOD (ROUTINE X 2)  URINE CULTURE  LACTIC ACID, PLASMA  PROTIME-INR  PROCALCITONIN  POC URINE PREG, ED  POC SARS CORONAVIRUS 2 AG -  ED  POCT PREGNANCY, URINE  POC SARS CORONAVIRUS 2 AG   ____________________________________________  EKG  ED ECG REPORT I, 05/18/19, the  attending physician, personally viewed and interpreted this ECG.  Date: 03/20/2019 EKG Time: 4:40 am Rate: 132 Rhythm: sinus tachycardia  4:40 AM4:40 AMQRS Axis: normal Intervals: normal ST/T Wave abnormalities: normal Narrative Interpretation: no evidence of acute ischemia  ____________________________________________  RADIOLOGY I, Loleta Rose, personally viewed and evaluated these images (plain radiographs) as part of my medical decision making, as well as reviewing the written report by the radiologist.  ED MD interpretation: No acute abnormalities identified on chest x-ray nor pelvic ultrasound.  CT scan pending at the time of discharge.  Official radiology report(s): DG Chest 1 View  Result Date: 03/20/2019 CLINICAL DATA:  Fever with shortness of breath and generalized pain. EXAM: CHEST  1 VIEW COMPARISON:  None. FINDINGS: 0448 hours. The lungs are clear without focal pneumonia, edema, pneumothorax or pleural effusion. Scarring or small bulla in the right apex. The cardiopericardial silhouette is within normal limits for size. The visualized bony structures of the thorax are intact. Telemetry leads overlie the chest. IMPRESSION: No active  disease. Electronically Signed   By: Kennith Center M.D.   On: 03/20/2019 05:42   US PELVIC COMPLETE WITH TRANSVAGINAL  Result Date: 03/20/2019 CLINICAL DATA:  Suprapubic pain.  History of PID. EXAM: TRANSABDOMINAL AND TRANSVAGINAL ULTRASOUND OF PELVIS TECHNIQUE: Both transabdominal and transvaginal ultrasound examinations of the pelvis were performed. Transabdominal technique was performed for global imaging of the pelvis including uterus, ovaries, adnexal regions, and pelvic cul-de-sac. It was necessary to proceed with endovaginal exam following the transabdominal exam to visualize the endometrium. COMPARISON:  CT abdomen/pelvis 01/12/2014 FINDINGS: Uterus Measurements: 7.5 x 4.2 x 5.1 cm = volume: 84 mL. No fibroids or other mass visualized. Endometrium Thickness: 3 mm.  No focal abnormality visualized. Right ovary Measurements: 3.2 x 1.3 x 1.5 cm = volume: 3.1 mL. Normal appearance/no adnexal mass. Left ovary Measurements: 2.1 x 1.8 cm. Patient was unable to tolerate endovaginal scanning and ended the exam before complete measurement of the left ovary could be obtained. Normal appearance/no adnexal mass. Other findings No abnormal free fluid. IMPRESSION: 1. Unremarkable exam. No specific findings to explain the patient's history of suprapubic pain. Electronically Signed   By: Kennith Center M.D.   On: 03/20/2019 07:07    ____________________________________________   PROCEDURES   Procedure(s) performed (including Critical Care):  .Critical Care Performed by: Loleta Rose, MD Authorized by: Loleta Rose, MD   Critical care provider statement:    Critical care time (minutes):  30   Critical care time was exclusive of:  Separately billable procedures and treating other patients   Critical care was necessary to treat or prevent imminent or life-threatening deterioration of the following conditions:  Sepsis   Critical care was time spent personally by me on the following activities:  Development  of treatment plan with patient or surrogate, discussions with consultants, evaluation of patient's response to treatment, examination of patient, obtaining history from patient or surrogate, ordering and performing treatments and interventions, ordering and review of laboratory studies, ordering and review of radiographic studies, pulse oximetry, re-evaluation of patient's condition and review of old charts     ____________________________________________   INITIAL IMPRESSION / MDM / ASSESSMENT AND PLAN / ED COURSE  As part of my medical decision making, I reviewed the following data within the electronic MEDICAL RECORD NUMBER Nursing notes reviewed and incorporated, Labs reviewed , EKG interpreted , Old chart reviewed, Radiograph reviewed  and Notes from prior ED visits   Differential diagnosis includes, but is not limited to, COVID-19,  STD/PID/TOA, UTI/pyelonephritis, pneumonia.  The patient has had a couple presentations of PID in the past.  I talked to her about at this time and offered a pelvic exam but also explained that the GC/chlamydia test would not come back rapidly anymore because it is now a send out test.  She asked to just be treated.  I clarified several times that she does not want me to check for GC/committee or have a pelvic exam and just wants to be treated empirically and she clarified that yes that is her preference.  She reportedly has an anaphylactic allergy to penicillins and has been treated successfully in the past with azithromycin 2 g p.o. and a 14-day course of Levaquin 500 mg by mouth as per CDC recommendations.  She was also treated with Flagyl in the past.  However given the current pandemic COVID-19 is also very likely.  The 15-minute rapid antigen test was negative and the respiratory viral panel is pending.  She is febrile, tachypneic, and tachycardic, and the full septic work-up is pending.  So far CBC is back demonstrating a leukocytosis of 14 which is not strictly  consistent with COVID-19.  Given the concerns for PID and the possibility of a tubo-ovarian abscess even though her abdominal exam is relatively benign, I have ordered a pelvic ultrasound to which she consented to look for any evidence of TOA.  I am beginning treatment with 4 mg of IV Zofran, 1 L normal saline IV bolus, azithromycin 2 g by mouth, Levaquin 500 mg by mouth, Flagyl 500 mg by mouth, and acetaminophen 1000 mg by mouth.      Clinical Course as of Mar 19 717  Sat Mar 20, 2019  0527 WBC(!): 14.4 [CF]  7893 No acute abnormalities identified on chest x-ray  DG Chest 1 View [CF]  [phone] Lactic Acid, Venous: 1.4 [CF]  0601 Preg Test, Ur: NEGATIVE [CF]  0607 Procalcitonin: 0.10 [CF]  0614 Very mild elevation of LFTs, non-specific  Comprehensive metabolic panel(!) [CF]  [phone] Strongly positive UA, ordering urine culture  Urinalysis, Complete w Microscopic(!) [CF]  2585 Given the strong possibility of UTI/pyelonephritis versus PID, I indicated the patient could sepsis and have ordered a second liter of normal saline IV fluids.   [CF]  2778 She has received extensive antibiotic coverage including Levaquin 500 mg and there is no indication to order further antibiotics at this time.   [CF]  0618 SARS Coronavirus 2 by RT PCR: NEGATIVE [CF]  0645 I offered the patient admission based on meeting sepsis criteria and her degree of discomfort.  However she says she cannot stay in the hospital because of responsibilities at home and prefers to be managed as an outpatient.  I will treat her empirically and aggressively with antibiotics and she understands that if she changes her mind and wants to stay in the hospital she may.  I am awaiting the results of the ultrasound and then anticipate empiric treatment.   [CF]  0656 SARS Coronavirus 2 by RT PCR: NEGATIVE [CF]  0657 Given the possibility of an infected kidney stone, I am going to evaluate with a CT abd/pelvis w/ IV contrast.   [CF]  0715  Unremarkable pelvic ultrasound.  Proceeding with CT scan of the abdomen pelvis.  As long as there is no evidence of a urological emergency or other acute intra-abdominal emergency, even if she has CT evidence of pyelonephritis, the patient can be discharged per her request for outpatient follow-up, but she was offered  admission and is declining to stay.  US PELVIC COMPLETE WITH TRANSVAGINAL [CF]    Clinical Course User Index [CF] Loleta RoseForbach, Ulice Follett, MD     ____________________________________________  FINAL CLINICAL IMPRESSION(S) / ED DIAGNOSES  Final diagnoses:  Pyelonephritis  Pelvic inflammatory disease     MEDICATIONS GIVEN DURING THIS VISIT:  Medications  sodium chloride 0.9 % bolus 1,000 mL (1,000 mLs Intravenous New Bag/Given 03/20/19 0623)  acetaminophen (TYLENOL) tablet 1,000 mg (1,000 mg Oral Given 03/20/19 0512)  sodium chloride 0.9 % bolus 1,000 mL (0 mLs Intravenous Stopped 03/20/19 0619)  ondansetron (ZOFRAN) injection 4 mg (4 mg Intravenous Given 03/20/19 0540)  azithromycin (ZITHROMAX) tablet 2,000 mg (2,000 mg Oral Given 03/20/19 0540)  levofloxacin (LEVAQUIN) tablet 500 mg (500 mg Oral Given 03/20/19 0540)  metroNIDAZOLE (FLAGYL) tablet 500 mg (500 mg Oral Given 03/20/19 0540)  iohexol (OMNIPAQUE) 300 MG/ML solution 100 mL (100 mLs Intravenous Contrast Given 03/20/19 0717)     ED Discharge Orders         Ordered    levofloxacin (LEVAQUIN) 500 MG tablet  Daily     03/20/19 0653    metroNIDAZOLE (FLAGYL) 500 MG tablet  2 times daily     03/20/19 0653    ondansetron (ZOFRAN ODT) 4 MG disintegrating tablet     03/20/19 [id number]          *Please note:  Noberto Retortlicia I Karger was evaluated in Emergency Department on 03/20/2019 for the symptoms described in the history of present illness. She was evaluated in the context of the global COVID-19 pandemic, which necessitated consideration that the patient might be at risk for infection with the SARS-CoV-2 virus that causes COVID-19. Institutional  protocols and algorithms that pertain to the evaluation of patients at risk for COVID-19 are in a state of rapid change based on information released by regulatory bodies including the CDC and federal and state organizations. These policies and algorithms were followed during the patient's care in the ED.  Some ED evaluations and interventions may be delayed as a result of limited staffing during the pandemic.*  Note:  This document was prepared using Dragon voice recognition software and may include unintentional dictation errors.   Loleta RoseForbach, Matthan Sledge, MD 03/20/19 [phone]

## 2019-03-22 LAB — URINE CULTURE
Culture: 80000 — AB
Special Requests: NORMAL

## 2019-03-25 LAB — CULTURE, BLOOD (ROUTINE X 2)
Culture: NO GROWTH
Culture: NO GROWTH
Special Requests: ADEQUATE
Special Requests: ADEQUATE

## 2019-06-06 ENCOUNTER — Emergency Department: Payer: Medicaid Other

## 2019-06-06 ENCOUNTER — Encounter: Payer: Self-pay | Admitting: Emergency Medicine

## 2019-06-06 ENCOUNTER — Other Ambulatory Visit: Payer: Self-pay

## 2019-06-06 ENCOUNTER — Emergency Department
Admission: EM | Admit: 2019-06-06 | Discharge: 2019-06-06 | Disposition: A | Discharge status: Home / Self Care | Payer: Medicaid Other | Attending: Emergency Medicine | Admitting: Emergency Medicine

## 2019-06-06 DIAGNOSIS — O99331 Smoking (tobacco) complicating pregnancy, first trimester: Secondary | ICD-10-CM | POA: Insufficient documentation

## 2019-06-06 DIAGNOSIS — O200 Threatened abortion: Secondary | ICD-10-CM | POA: Diagnosis not present

## 2019-06-06 DIAGNOSIS — N939 Abnormal uterine and vaginal bleeding, unspecified: Secondary | ICD-10-CM

## 2019-06-06 DIAGNOSIS — F17210 Nicotine dependence, cigarettes, uncomplicated: Secondary | ICD-10-CM | POA: Diagnosis not present

## 2019-06-06 DIAGNOSIS — Z79899 Other long term (current) drug therapy: Secondary | ICD-10-CM | POA: Insufficient documentation

## 2019-06-06 DIAGNOSIS — Z3A01 Less than 8 weeks gestation of pregnancy: Secondary | ICD-10-CM | POA: Diagnosis not present

## 2019-06-06 DIAGNOSIS — O209 Hemorrhage in early pregnancy, unspecified: Secondary | ICD-10-CM | POA: Diagnosis present

## 2019-06-06 HISTORY — DX: Endometriosis, unspecified: N80.9

## 2019-06-06 LAB — CBC
HCT: 33.3 % — ABNORMAL LOW (ref 36.0–46.0)
Hemoglobin: 11.6 g/dL — ABNORMAL LOW (ref 12.0–15.0)
MCH: 30.2 pg (ref 26.0–34.0)
MCHC: 34.8 g/dL (ref 30.0–36.0)
MCV: 86.7 fL (ref 80.0–100.0)
Platelets: 353 10*3/uL (ref 150–400)
RBC: 3.84 MIL/uL — ABNORMAL LOW (ref 3.87–5.11)
RDW: 12.5 % (ref 11.5–15.5)
WBC: 7.9 10*3/uL (ref 4.0–10.5)
nRBC: 0 % (ref 0.0–0.2)

## 2019-06-06 LAB — URINALYSIS, COMPLETE (UACMP) WITH MICROSCOPIC
Bacteria, UA: NONE SEEN
Bilirubin Urine: NEGATIVE
Glucose, UA: NEGATIVE mg/dL
Ketones, ur: NEGATIVE mg/dL
Nitrite: NEGATIVE
Protein, ur: 30 mg/dL — AB
Specific Gravity, Urine: 1.019 (ref 1.005–1.030)
pH: 6 (ref 5.0–8.0)

## 2019-06-06 LAB — HCG, QUANTITATIVE, PREGNANCY: hCG, Beta Chain, Quant, S: 39865 m[IU]/mL — ABNORMAL HIGH (ref <5)

## 2019-06-06 LAB — ABO/RH: ABO/RH(D): O POS

## 2019-06-06 NOTE — ED Triage Notes (Signed)
Patient states that she is about [redacted] weeks pregnant and started having vaginal bleeding yesterday.

## 2019-06-06 NOTE — Discharge Instructions (Addendum)
Ultrasound is concerned for a failed pregnancy. You need to have a repeat ultrasound in one week to make sure you passed the products of conception. You can expect a heavier than normal period for the next week. Return to the ER if your bleeding is too heavy and you feel like you are going to pass out.

## 2019-06-06 NOTE — ED Provider Notes (Signed)
[facility] Emergency Department Provider Note  ____________________________________________  Time seen: Approximately 8:12 AM  I have reviewed the triage vital signs and the nursing notes.   HISTORY  Chief Complaint Vaginal Bleeding   HPI Andrea Mcknight is a 34 y.o. female who presents for evaluation of vaginal bleeding and a positive pregnancy test at home. LMP 12 weeks ago. Started spotting last night. No severe bleeding. No abdominal pain, no discharge, no dizziness.  Spotting has been continuous for the last 24 hours.  Has not established care for this pregnancy yet.  This is pregnancy #9.  Patient had 5 live births and 3 miscarriages.   Past Medical History:  Diagnosis Date  . Endometriosis   . History of substance abuse ([place])   . Pelvic inflammatory disease (PID)    at least twice previously    There are no problems to display for this patient.   Past Surgical History:  Procedure Laterality Date  . LAPAROSCOPY     endometriosis    Prior to Admission medications   Medication Sig Start Date End Date Taking? Authorizing Provider  clindamycin (CLEOCIN) 150 MG capsule Take 1 capsule (150 mg total) by mouth 4 (four) times daily. 11/02/15   Sable Feil, PA-C  doxycycline (VIBRAMYCIN) 100 MG capsule Take 1 capsule (100 mg total) by mouth 2 (two) times daily. 03/01/16   Carrie Mew, MD  ibuprofen (ADVIL,MOTRIN) 600 MG tablet Take 1 tablet (600 mg total) by mouth every 6 (six) hours as needed for moderate pain. 11/02/14   Joanne Gavel, MD  metoCLOPramide (REGLAN) 10 MG tablet Take 1 tablet (10 mg total) by mouth every 6 (six) hours as needed. 03/01/16   Carrie Mew, MD  naproxen (NAPROSYN) 500 MG tablet Take 1 tablet (500 mg total) by mouth 2 (two) times daily with a meal. 03/01/16   Carrie Mew, MD  ondansetron (ZOFRAN ODT) 4 MG disintegrating tablet Allow 1-2 tablets to dissolve in your mouth every 8 hours as needed for  nausea/vomiting 03/20/19   Hinda Kehr, MD  oxyCODONE-acetaminophen (PERCOCET) 7.5-325 MG tablet Take 1 tablet by mouth every 4 (four) hours as needed for severe pain. 11/02/15   Sable Feil, PA-C  traMADol (ULTRAM) 50 MG tablet Take 1 tablet (50 mg total) by mouth every 6 (six) hours as needed. 09/20/15   Triplett, Johnette Abraham B, FNP    Allergies Penicillins and Tramadol  No family history on file.  Social History Social History   Tobacco Use  . Smoking status: Current Every Day Smoker    Packs/day: 1.00    Types: Cigarettes  . Smokeless tobacco: Never Used  Substance Use Topics  . Alcohol use: No  . Drug use: No    Review of Systems  Constitutional: Negative for fever. Eyes: Negative for visual changes. ENT: Negative for sore throat. Neck: No neck pain  Cardiovascular: Negative for chest pain. Respiratory: Negative for shortness of breath. Gastrointestinal: Negative for abdominal pain, vomiting or diarrhea. Genitourinary: Negative for dysuria. + spotting Musculoskeletal: Negative for back pain. Skin: Negative for rash. Neurological: Negative for headaches, weakness or numbness. Psych: No SI or HI  ____________________________________________   PHYSICAL EXAM:  VITAL SIGNS: Vitals:   06/06/19 0825 06/06/19 0827  BP: 112/77   Pulse:  99  Resp:    Temp:    SpO2:  100%   Constitutional: Alert and oriented. Well appearing and in no apparent distress. HEENT:      Head: Normocephalic and atraumatic.  Eyes: Conjunctivae are normal. Sclera is non-icteric.       Mouth/Throat: Mucous membranes are moist.       Neck: Supple with no signs of meningismus. Cardiovascular: Regular rate and rhythm. No murmurs, gallops, or rubs.  Respiratory: Normal respiratory effort. Lungs are clear to auscultation bilaterally.  Gastrointestinal: Soft, non tender, and non distended with positive bowel sounds. Musculoskeletal: No edema, cyanosis, or erythema of extremities. Neurologic:  Normal speech and language. Face is symmetric. Moving all extremities. No gross focal neurologic deficits are appreciated. Skin: Skin is warm, dry and intact. No rash noted. Psychiatric: Mood and affect are normal. Speech and behavior are normal.  ____________________________________________   LABS (all labs ordered are listed, but only abnormal results are displayed)  Labs Reviewed  URINALYSIS, COMPLETE (UACMP) WITH MICROSCOPIC - Abnormal; Notable for the following components:      Result Value   Color, Urine YELLOW (*)    APPearance HAZY (*)    Hgb urine dipstick MODERATE (*)    Protein, ur 30 (*)    Leukocytes,Ua TRACE (*)    All other components within normal limits  CBC - Abnormal; Notable for the following components:   RBC 3.84 (*)    Hemoglobin 11.6 (*)    HCT 33.3 (*)    All other components within normal limits  HCG, QUANTITATIVE, PREGNANCY - Abnormal; Notable for the following components:   hCG, Beta Chain, Quant, S 07,622 (*)    All other components within normal limits  POC URINE PREG, ED  ABO/RH   ____________________________________________  EKG  none  ____________________________________________  RADIOLOGY  I have personally reviewed the images performed during this visit and I agree with the Radiologist's read.   Interpretation by Radiologist:  US OB LESS THAN 14 WEEKS WITH OB TRANSVAGINAL  Result Date: 06/06/2019 CLINICAL DATA:  Initial evaluation for acute vaginal bleeding, early pregnancy. EXAM: OBSTETRIC <14 WK [place] AND TRANSVAGINAL OB US TECHNIQUE: Both transabdominal and transvaginal ultrasound examinations were performed for complete evaluation of the gestation as well as the maternal uterus, adnexal regions, and pelvic cul-de-sac. Transvaginal technique was performed to assess early pregnancy. COMPARISON:  Prior ultrasound from 03/20/2019. FINDINGS: Intrauterine gestational sac: Single. Scattered low-level echoes seen within the gestational sac. Yolk  sac:  Present Embryo:  Present Cardiac Activity: Negative. Heart Rate: N/A  bpm CRL: 3.9 mm   6 w   0 d                  [place] EDC: 01/30/2020 Subchorionic hemorrhage:  None visualized. Maternal uterus/adnexae: Neither ovary visualized due to overlying bowel gas. No adnexal mass or free fluid. IMPRESSION: 1. Single IUP containing internal yolk sac and embryo, but no detectable cardiac activity. Crown-rump length measures 3.9 mm. Findings are suspicious but not yet definitive for failed pregnancy. Recommend follow-up US in 10-14 days for definitive diagnosis. This recommendation follows SRU consensus guidelines: Diagnostic Criteria for Nonviable Pregnancy Early in the First Trimester. Malva Limes Med 2013; 633:3545-62. 2. No other acute maternal uterine or adnexal abnormality identified. Electronically Signed   By: Rise Mu M.D.   On: 06/06/2019 07:55     ____________________________________________   PROCEDURES  Procedure(s) performed: None Procedures Critical Care performed:  None ____________________________________________   INITIAL IMPRESSION / ASSESSMENT AND PLAN / ED COURSE   34 y.o. female who presents for evaluation of vaginal bleeding and a positive pregnancy test at home.  Patient is hemodynamically stable with positive hCG.  Ultrasound measuring 6 weeks  with no detectable cardiac activity most likely failed pregnancy.  No signs of acute blood loss anemia.  Patient's blood type O+ no indication for RhoGam.  Discussed STD testing but patient declined.  Recommended close follow-up in a week for repeat ultrasound to ensure complete passage of products of conception.  Discussed my return precautions for any signs of acute blood loss anemia.      _____________________________________________ Please note:  Patient was evaluated in Emergency Department today for the symptoms described in the history of present illness. Patient was evaluated in the context of the global COVID-19  pandemic, which necessitated consideration that the patient might be at risk for infection with the SARS-CoV-2 virus that causes COVID-19. Institutional protocols and algorithms that pertain to the evaluation of patients at risk for COVID-19 are in a state of rapid change based on information released by regulatory bodies including the CDC and federal and state organizations. These policies and algorithms were followed during the patient's care in the ED.  Some ED evaluations and interventions may be delayed as a result of limited staffing during the pandemic.   ____________________________________________   FINAL CLINICAL IMPRESSION(S) / ED DIAGNOSES   Final diagnoses:  Threatened miscarriage      NEW MEDICATIONS STARTED DURING THIS VISIT:  ED Discharge Orders    None       Note:  This document was prepared using Dragon voice recognition software and may include unintentional dictation errors.    Don Perking, [place], MD 06/06/19 [phone]

## 2019-06-06 NOTE — ED Notes (Signed)
Report to lorrie, rn.  

## 2019-12-04 ENCOUNTER — Encounter: Payer: Self-pay | Admitting: Emergency Medicine

## 2019-12-04 ENCOUNTER — Emergency Department
Admission: EM | Admit: 2019-12-04 | Discharge: 2019-12-04 | Disposition: A | Discharge status: Home / Self Care | Payer: Medicaid Other | Attending: Emergency Medicine | Admitting: Emergency Medicine

## 2019-12-04 ENCOUNTER — Other Ambulatory Visit: Payer: Self-pay

## 2019-12-04 DIAGNOSIS — S0191XA Laceration without foreign body of unspecified part of head, initial encounter: Secondary | ICD-10-CM | POA: Insufficient documentation

## 2019-12-04 DIAGNOSIS — W208XXA Other cause of strike by thrown, projected or falling object, initial encounter: Secondary | ICD-10-CM | POA: Diagnosis not present

## 2019-12-04 DIAGNOSIS — Z5321 Procedure and treatment not carried out due to patient leaving prior to being seen by health care provider: Secondary | ICD-10-CM | POA: Diagnosis not present

## 2019-12-04 NOTE — ED Notes (Signed)
Pt notified of leaving ED with ride. Instructed to return if symptoms worsen
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
## 2020-12-10 ENCOUNTER — Emergency Department
Admission: EM | Admit: 2020-12-10 | Discharge: 2020-12-10 | Payer: Medicaid Other | Attending: Emergency Medicine | Admitting: Emergency Medicine

## 2020-12-10 ENCOUNTER — Encounter: Payer: Self-pay | Admitting: Emergency Medicine

## 2020-12-10 ENCOUNTER — Other Ambulatory Visit: Payer: Self-pay

## 2020-12-10 DIAGNOSIS — T402X1A Poisoning by other opioids, accidental (unintentional), initial encounter: Secondary | ICD-10-CM | POA: Diagnosis not present

## 2020-12-10 DIAGNOSIS — X58XXXA Exposure to other specified factors, initial encounter: Secondary | ICD-10-CM | POA: Insufficient documentation

## 2020-12-10 DIAGNOSIS — F17210 Nicotine dependence, cigarettes, uncomplicated: Secondary | ICD-10-CM | POA: Diagnosis not present

## 2020-12-10 DIAGNOSIS — T40601A Poisoning by unspecified narcotics, accidental (unintentional), initial encounter: Secondary | ICD-10-CM

## 2020-12-10 MED ORDER — ACETAMINOPHEN 500 MG PO TABS
1000.0000 mg | ORAL_TABLET | Freq: Once | ORAL | Status: AC
  Administered 2020-12-10: 1000 mg via ORAL
  Filled 2020-12-10: qty 2

## 2020-12-10 MED ORDER — SODIUM CHLORIDE 0.9 % IV BOLUS
1000.0000 mL | Freq: Once | INTRAVENOUS | Status: AC
  Administered 2020-12-10: 1000 mL via INTRAVENOUS

## 2020-12-10 NOTE — ED Notes (Signed)
Pt dc ppw provided. RN at [facility] [employer] given verbal report on patient. Pt to car in police custody via wheelchair

## 2020-12-10 NOTE — Discharge Instructions (Addendum)
Please seek medical attention for any high fevers, chest pain, shortness of breath, change in behavior, persistent vomiting, bloody stool or any other new or concerning symptoms.  

## 2020-12-10 NOTE — ED Notes (Signed)
Pt is slightly drowsy, nauseated and is endorsing pain all over entire body. Pt given sips of water and emesis bag. MD aware of patient arousal

## 2020-12-10 NOTE — ED Provider Notes (Signed)
[facility] Emergency Department Provider Note   ____________________________________________   I have reviewed the triage vital signs and the nursing notes.   HISTORY  Chief Complaint Drug Overdose   History limited by: Not cooperative   HPI Andrea Mcknight is a 35 y.o. female who [facility] the emergency department today from jail after an apparent overdose. The patient had stated she ingested an unknown white substance. Patient was given narcan at the jail. Patient not offering any history at the time of my exam.    Records reviewed. Per medical record review patient has a history of substance abuse.  Past Medical History:  Diagnosis Date   Endometriosis    History of substance abuse ([facility])    Pelvic inflammatory disease (PID)    at least twice previously    There are no problems to display for this patient.   Past Surgical History:  Procedure Laterality Date   LAPAROSCOPY     endometriosis    Prior to Admission medications   Medication Sig Start Date End Date Taking? Authorizing Provider  clindamycin (CLEOCIN) 150 MG capsule Take 1 capsule (150 mg total) by mouth 4 (four) times daily. 11/02/15   Joni Reining, PA-C  doxycycline (VIBRAMYCIN) 100 MG capsule Take 1 capsule (100 mg total) by mouth 2 (two) times daily. 03/01/16   Sharman Cheek, MD  ibuprofen (ADVIL,MOTRIN) 600 MG tablet Take 1 tablet (600 mg total) by mouth every 6 (six) hours as needed for moderate pain. 11/02/14   Gayla Doss, MD  metoCLOPramide (REGLAN) 10 MG tablet Take 1 tablet (10 mg total) by mouth every 6 (six) hours as needed. 03/01/16   Sharman Cheek, MD  naproxen (NAPROSYN) 500 MG tablet Take 1 tablet (500 mg total) by mouth 2 (two) times daily with a meal. 03/01/16   Sharman Cheek, MD  ondansetron (ZOFRAN ODT) 4 MG disintegrating tablet Allow 1-2 tablets to dissolve in your mouth every 8 hours as needed for nausea/vomiting 03/20/19   Loleta Rose, MD   oxyCODONE-acetaminophen (PERCOCET) 7.5-325 MG tablet Take 1 tablet by mouth every 4 (four) hours as needed for severe pain. 11/02/15   Joni Reining, PA-C  traMADol (ULTRAM) 50 MG tablet Take 1 tablet (50 mg total) by mouth every 6 (six) hours as needed. 09/20/15   Triplett, Rulon Eisenmenger B, FNP    Allergies Penicillins and Tramadol  No family history on file.  Social History Social History   Tobacco Use   Smoking status: Every Day    Packs/day: 1.00    Types: Cigarettes   Smokeless tobacco: Never  Substance Use Topics   Alcohol use: No   Drug use: No    Review of Systems Unable to obtain secondary to non cooperativeness.  ____________________________________________   PHYSICAL EXAM:  VITAL SIGNS: ED Triage Vitals [12/10/20 1459]  Enc Vitals Group     BP 139/90     Pulse Rate (!) 104     Resp 16     Temp      Temp src      SpO2 100 %     Weight      Height      Head Circumference      Peak Flow      Pain Score 0   Constitutional: Awake and alert.  Eyes: Conjunctivae are normal.  ENT      Head: Normocephalic and atraumatic.      Nose: No congestion/rhinnorhea.      Mouth/Throat: Mucous  membranes are moist.      Neck: No stridor. Hematological/Lymphatic/Immunilogical: No cervical lymphadenopathy. Cardiovascular: Normal rate, regular rhythm.  No murmurs, rubs, or gallops.  Respiratory: Normal respiratory effort without tachypnea nor retractions. Breath sounds are clear and equal bilaterally. No wheezes/rales/rhonchi. Gastrointestinal: Soft and non tender. No rebound. No guarding.  Genitourinary: Deferred Musculoskeletal: Normal range of motion in all extremities. No lower extremity edema. Neurologic:  Normal speech and language. No gross focal neurologic deficits are appreciated.  Skin:  Skin is warm, dry and intact. No rash noted. Psychiatric: Mood and affect are normal. Speech and behavior are normal. Patient exhibits appropriate insight and  judgment.  ____________________________________________    LABS (pertinent positives/negatives)  None  ____________________________________________   EKG  I, Phineas Semen, attending physician, personally viewed and interpreted this EKG  EKG Time: 1459 Rate: 112 Rhythm: sinus tachycardia Axis: normal Intervals: qtc 477 QRS: narrow ST changes: no st elevation Impression: abnormal ekg  ____________________________________________    RADIOLOGY  None  ____________________________________________   PROCEDURES  Procedures  ____________________________________________   INITIAL IMPRESSION / ASSESSMENT AND PLAN / ED COURSE  Pertinent labs & imaging results that were available during my care of the patient were reviewed by me and considered in my medical decision making (see chart for details).   Patient presents to the emergency department after apparent opioid overdose from jail. Patient was given narcan on scene. On initial exam patient was not very cooperative. She was observed here in the emergency department without any concerning respiratory depression. She did become more talkative while here. Will plan on discharging.   ____________________________________________   FINAL CLINICAL IMPRESSION(S) / ED DIAGNOSES  Final diagnoses:  Opiate overdose, accidental or unintentional, initial encounter [facility])     Note: This dictation was prepared with Dragon dictation. Any transcriptional errors that result from this process are unintentional     Phineas Semen, MD 12/10/20 1858

## 2020-12-10 NOTE — ED Triage Notes (Signed)
[facility] [place] [facility] [place]. Pt is currently in PD custody. Pt states that she ate an unknown white substance. Pt was given 4 mg of Narcan prior to EMS arrival. Pt has been tachycardic with EMS. Pt arrives to ED, alert but sleepy. Pt is still tachycardic.   Jail staff present with patient states that pt had started to have convulsions when staff found her and they immediately administered the Narcan.
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
## 2021-08-23 ENCOUNTER — Other Ambulatory Visit: Payer: Self-pay

## 2021-08-23 ENCOUNTER — Emergency Department
Admission: EM | Admit: 2021-08-23 | Discharge: 2021-08-23 | Disposition: A | Discharge status: Home / Self Care | Payer: Medicaid Other | Attending: Emergency Medicine | Admitting: Emergency Medicine

## 2021-08-23 DIAGNOSIS — K0889 Other specified disorders of teeth and supporting structures: Secondary | ICD-10-CM | POA: Diagnosis present

## 2021-08-23 DIAGNOSIS — K029 Dental caries, unspecified: Secondary | ICD-10-CM | POA: Insufficient documentation

## 2021-08-23 MED ORDER — HYDROCODONE-ACETAMINOPHEN 5-325 MG PO TABS: 1.0000 | ORAL_TABLET | Freq: Four times a day (QID) | ORAL | 0 refills | Status: AC | PRN

## 2021-08-23 MED ORDER — CLINDAMYCIN HCL 150 MG PO CAPS: 300.0000 mg | ORAL_CAPSULE | Freq: Three times a day (TID) | ORAL | 0 refills | Status: AC

## 2021-08-23 NOTE — Discharge Instructions (Addendum)
OPTIONS FOR DENTAL FOLLOW UP CARE ° °[place] Department of [facility] and Human Services - Local Safety Net Dental Clinics °http://www.ncdhhs.gov/dph/oralhealth/services/safetynetclinics.htm °  °[facility] (336-562-3123) ° °[facility] ([phone]) ° °[facility] ([phone] ext [phone]) ° °[facility] ([phone]) ° °[facility] ([phone]) °This clinic caters to the indigent population and is on a lottery system. °Location: °[facility], [address], [place] °Clinic Hours: °Wednesdays from 6pm - 9pm, patients seen by a lottery system. °For dates, call or go to [email or web address]/[facility]/patients/[email or web address] °Services: °Cleanings, fillings and simple extractions. °Payment Options: °DENTAL WORK IS FREE OF CHARGE. Bring proof of income or support. °Best way to get seen: °Arrive at 5:15 pm - this is a lottery, NOT first come/first serve, so arriving earlier will not increase your chances of being seen. °  °  °[facility] °[phone] °Select option 1 for emergencies °  °Location: °[facility], [address], [place] °Clinic Hours: °No walk-ins accepted - call the day before to schedule an appointment. °Check in times are 9:30 am and 1:30 pm. °Services: °Simple extractions, temporary fillings, pulpectomy/pulp debridement, uncomplicated abscess drainage. °Payment Options: °PAYMENT IS DUE AT THE TIME OF SERVICE.  Fee is usually $100-200, additional surgical procedures (e.g. abscess drainage) may be extra. °Cash, checks, Visa/MasterCard accepted.  Can file Medicaid if patient is covered for dental - patient should call case worker to check. °No discount for [facility] patients. °Best way to get seen: °MUST call the day before and get onto the schedule. Can usually be seen the next 1-2 days. No walk-ins accepted. °  °  °[facility] °[phone] °   °Location: °[facility], [address], [place] °Clinic Hours: °M, W, Th, F 8am or 1:30pm, Tues 9a or 1:30 - first come/first served. °Services: °Simple extractions, temporary fillings, uncomplicated abscess drainage.  You do not need to be an [place] resident. °Payment Options: °PAYMENT IS DUE AT THE TIME OF SERVICE. °Dental insurance, otherwise sliding scale - bring proof of income or support. °Depending on income and treatment needed, cost is usually $50-200. °Best way to get seen: °Arrive early as it is first come/first served. °  °  °[facility] °[phone] °  °Location: °[address] °Clinic Hours: °Mon-Thu 8a-5p °Services: °Most basic dental services including extractions and fillings. °Payment Options: °PAYMENT IS DUE AT THE TIME OF SERVICE. °Sliding scale, up to 50% off - bring proof if income or support. °Medicaid with dental option accepted. °Best way to get seen: °Call to schedule an appointment, can usually be seen within 2 weeks OR they will try to see walk-ins - show up at 8a or 2p (you may have to wait). °  °  °[facility] °[phone] °[place] RESIDENTS ONLY °  °Location: °[facility], [address], [place], [place] [address] °Clinic Hours: By appointment only. °Monday - Thursday 8am-5pm, Friday 8am-12pm °Services: Cleanings, fillings, extractions. °Payment Options: °PAYMENT IS DUE AT THE TIME OF SERVICE. °Cash, Visa or MasterCard. Sliding scale - $30 minimum per service. °Best way to get seen: °Come in to office, complete packet and make an appointment - need proof of income °or support monies for each household member and proof of [place] residence. °Usually takes about a month to get in. °  °  °[facility] °[phone] °  °Location: °[address]   [place] °Clinic Hours: Walk-in Urgent Care Dental Services are offered Monday-Friday  mornings only. °The numbers of emergencies accepted daily is limited to the number of °providers available. °Maximum 15 - Mondays, Wednesdays & Thursdays °Maximum 10 - Tuesdays & Fridays °Services: °You do not need to be a [place] resident to be seen for a dental emergency. °Emergencies are defined as pain, swelling, abnormal bleeding, or dental trauma. Walkins will receive x-rays if needed. °NOTE: Dental cleaning is not an emergency. °Payment Options: °PAYMENT IS DUE AT THE TIME OF SERVICE. °Minimum co-pay is $40.00 for uninsured patients. °Minimum co-pay is $3.00 for Medicaid with dental coverage. °Dental Insurance is accepted and must be presented at time of visit. °Medicare does not cover dental. °Forms of payment: Cash, credit card, checks. °Best way to get seen: °If not previously registered with the clinic, walk-in dental registration begins at 7:15 am and is on a first come/first serve basis. °If previously registered with the clinic, call to make an appointment. °  °  °[facility] °[phone] °[place] RESIDENTS ONLY °  °Location: °[address], [place], [place] °Clinic Hours: °Mon-Thu 10a-2p °Services: Extractions only! °Payment Options: °FREE (donations accepted) - bring proof of income or support °Best way to get seen: °Call and schedule an appointment OR come at 8am on the 1st Monday of every month (except for holidays) when it is first come/first served. °  °  °[facility] °[phone] °  °Location: °[address], [place] °Clinic Hours: °Friday mornings °Services, Payment Options, Best way to get seen: °Call for info °

## 2021-08-23 NOTE — ED Triage Notes (Signed)
Patient to ER via [facility] with complaints of right upper dental pain and left lower dental pain. Reports symptoms started several days ago. Hx of the same infected teeth.

## 2021-08-23 NOTE — ED Provider Notes (Signed)
[facility] Provider Note    Event Date/Time   First MD Initiated Contact with Patient 08/23/21 1330     (approximate)   History   Dental Pain   HPI  Andrea Mcknight is a 36 y.o. female with history of substance abuse Frequently infected teeth presents emergency department with dental pain.  Patient does have severe dental caries but is afraid to go to the dentist if she does not have money.  Denies fever or chills.  No chest pain or shortness of breath.  LMP is now      Physical Exam   Triage Vital Signs: ED Triage Vitals  Enc Vitals Group     BP 08/23/21 1325 107/77     Pulse Rate 08/23/21 1325 100     Resp 08/23/21 1325 17     Temp 08/23/21 1325 98.7 F (37.1 C)     Temp Source 08/23/21 1325 Oral     SpO2 08/23/21 1325 98 %     Weight 08/23/21 1334 115 lb 1.3 oz (52.2 kg)     Height 08/23/21 1334 5\' 4"  (1.626 m)     Head Circumference --      Peak Flow --      Pain Score 08/23/21 1325 10     Pain Loc --      Pain Edu? --      Excl. in GC? --     Most recent vital signs: Vitals:   08/23/21 1325  BP: 107/77  Pulse: 100  Resp: 17  Temp: 98.7 F (37.1 C)  SpO2: 98%     General: Awake, no distress.   CV:  Good peripheral perfusion. regular rate and  rhythm Resp:  Normal effort.  Abd:  No distention.   Other:  Severe dental caries noted, some redness and swelling noted   ED Results / Procedures / Treatments   Labs (all labs ordered are listed, but only abnormal results are displayed) Labs Reviewed - No data to display   EKG     RADIOLOGY     PROCEDURES:   Procedures   MEDICATIONS ORDERED IN ED: Medications - No data to display   IMPRESSION / MDM / ASSESSMENT AND PLAN / ED COURSE  I reviewed the triage vital signs and the nursing notes.                              Differential diagnosis includes, but is not limited to, dental abscess, dental caries, malingering  Patient's presentation is most  consistent with acute, uncomplicated illness.   I did discuss findings with patient.  Encouraged her to follow-up with a dentist.  Explained to her these dental caries and tooth pain will not subside until she sees a dentist.  Was given a list of dental clinics that work on [place].  She is given a prescription for clindamycin and only 8 Vicodin due to her history of substance abuse and accidental overdose.  She is also continue to take ibuprofen and Tylenol.  Patient is in agreement treatment plan.  Discharged stable condition.      FINAL CLINICAL IMPRESSION(S) / ED DIAGNOSES   Final diagnoses:  Pain due to dental caries     Rx / DC Orders   ED Discharge Orders          Ordered    clindamycin (CLEOCIN) 150 MG capsule  3 times daily  08/23/21 1345    HYDROcodone-acetaminophen (NORCO/VICODIN) 5-325 MG tablet  Every 6 hours PRN        08/23/21 1345             Note:  This document was prepared using Dragon voice recognition software and may include unintentional dictation errors.    Faythe Ghee, PA-C 08/23/21 1350    Jene Every, MD 08/23/21 [phone]

## 2021-09-14 ENCOUNTER — Encounter: Payer: Self-pay | Admitting: Intensive Care

## 2021-09-14 ENCOUNTER — Other Ambulatory Visit: Payer: Self-pay

## 2021-09-14 ENCOUNTER — Emergency Department
Admission: EM | Admit: 2021-09-14 | Discharge: 2021-09-14 | Discharge status: Against Medical Advice | Payer: Medicaid Other | Attending: Emergency Medicine | Admitting: Emergency Medicine

## 2021-09-14 DIAGNOSIS — K0889 Other specified disorders of teeth and supporting structures: Secondary | ICD-10-CM | POA: Diagnosis present

## 2021-09-14 DIAGNOSIS — Z5321 Procedure and treatment not carried out due to patient leaving prior to being seen by health care provider: Secondary | ICD-10-CM | POA: Insufficient documentation

## 2021-09-14 DIAGNOSIS — K0381 Cracked tooth: Secondary | ICD-10-CM | POA: Insufficient documentation

## 2021-09-14 NOTE — ED Notes (Signed)
Patient back up to front desk and reported she had went to car. Patient made aware she was taken off the board but I can move her back on now and asked to stay in waiting room until called for room

## 2021-09-14 NOTE — ED Triage Notes (Signed)
Patient c/o dental pain all over. Reports multiple broken teeth.

## 2021-09-14 NOTE — ED Provider Triage Note (Signed)
Emergency Medicine Provider Triage Evaluation Note  Andrea Mcknight , a 36 y.o. female  was evaluated in triage.  Pt complains of dental pain.  She reports multiple broken teeth but localizes pain to the right upper molar and the left lower molar.  She denies any fevers, chills, or sweats.  Review of Systems  Positive: Dental pain Negative: Purulent drainage, FCS  Physical Exam  BP 113/69 (BP Location: Left Arm)   Pulse (!) 103   Temp 99.1 F (37.3 C) (Oral)   Resp 16   Ht 5\' 4"  (1.626 m)   Wt 59.9 kg   LMP 08/22/2021 (Exact Date)   SpO2 97%   BMI 22.66 kg/m  Gen:   Awake, no distress NAD Resp:  Normal effort CTA MSK:   Moves extremities without difficulty  Other:    Medical Decision Making  Medically screening exam initiated at 3:54 PM.  Appropriate orders placed.  08/24/2021 was informed that the remainder of the evaluation will be completed by another provider, this initial triage assessment does not replace that evaluation, and the importance of remaining in the ED until their evaluation is complete.  Patient to the ED for evaluation of dental pain.   Noberto Retort, PA-C 09/14/21 1556

## 2021-11-11 IMAGING — US US OB < 14 WEEKS - US OB TV
1 series · 13 of 28 positions shown · non-contrast
Comparison: Prior ultrasound from 03/20/2019.

CLINICAL DATA: Initial evaluation for acute vaginal bleeding, early
pregnancy.

EXAM:
OBSTETRIC <14 WK US AND TRANSVAGINAL OB US
TECHNIQUE: Both transabdominal and transvaginal ultrasound examinations were
performed for complete evaluation of the gestation as well as the
maternal uterus, adnexal regions, and pelvic cul-de-sac.
Transvaginal technique was performed to assess early pregnancy.

[Series 1: us ob comp less 14 wks · 13 of 70 slices shown]
[im 3/70]
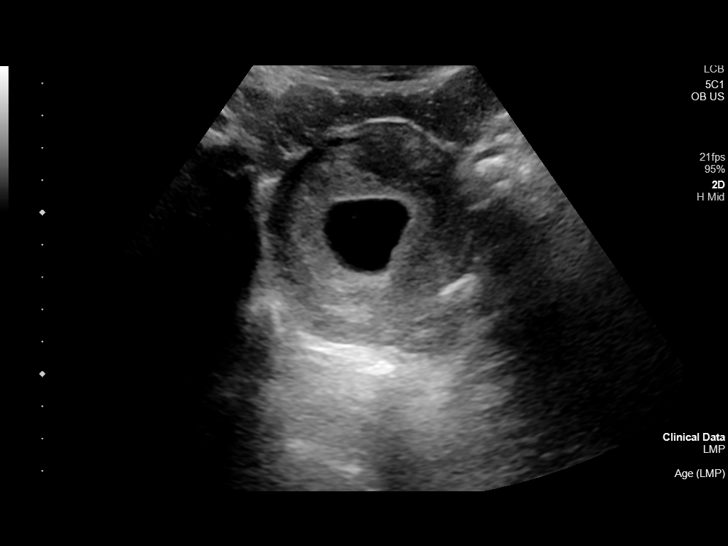
[im 8/70]
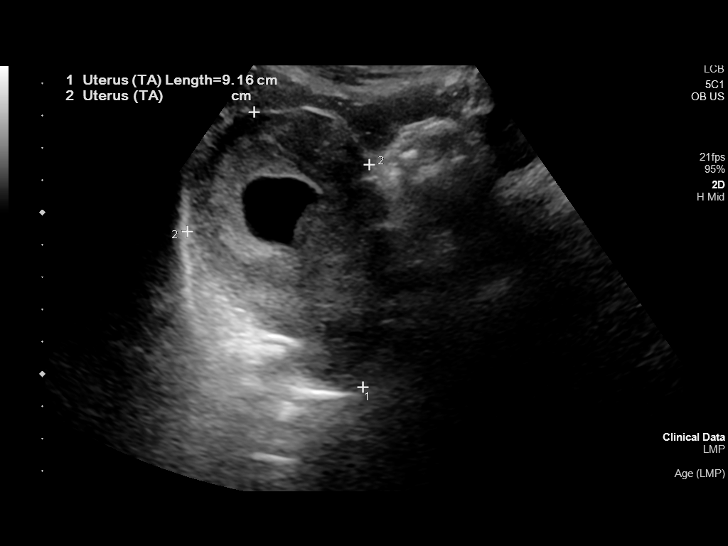
[im 13/70]
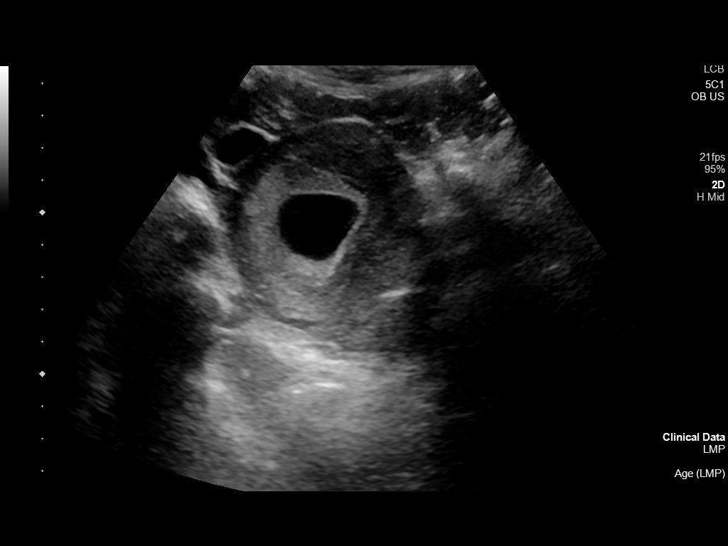
[im 18/70]
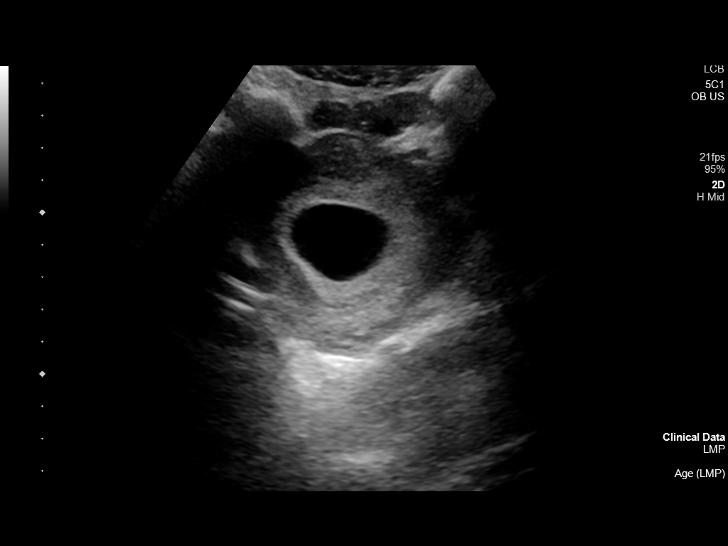
[im 24/70]
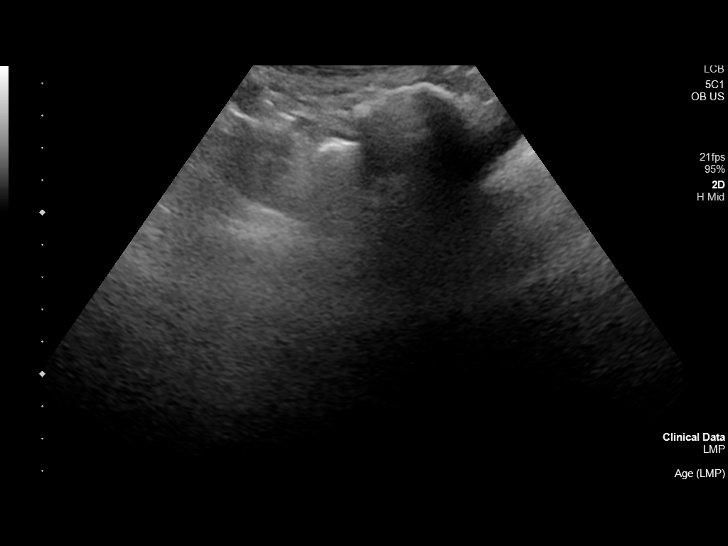
[im 29/70]
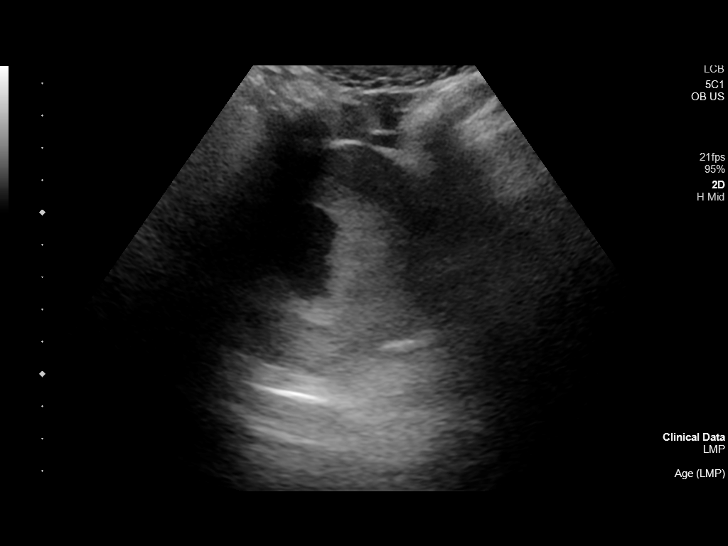
[im 36/70]
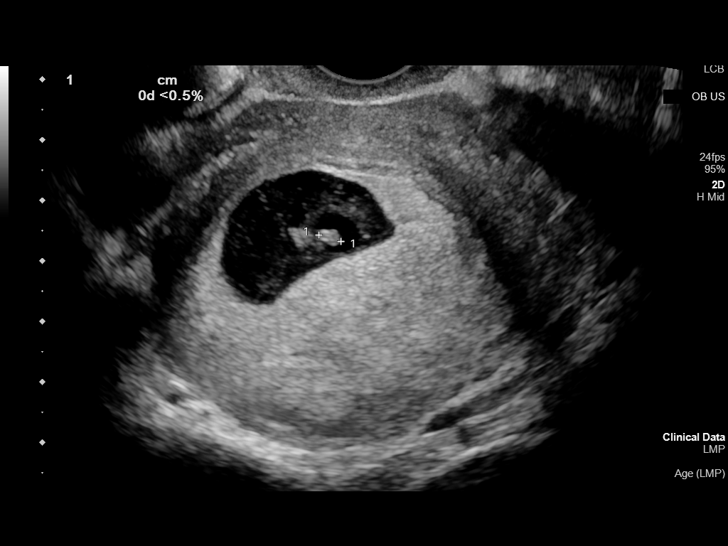
[im 41/70]
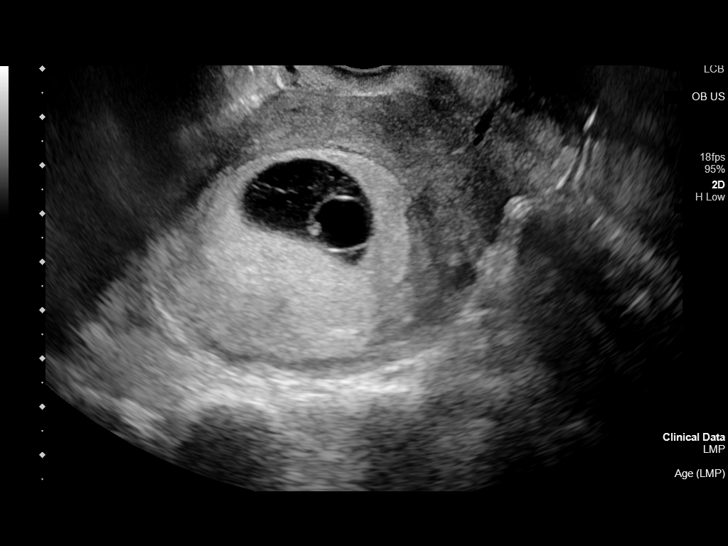
[im 47/70]
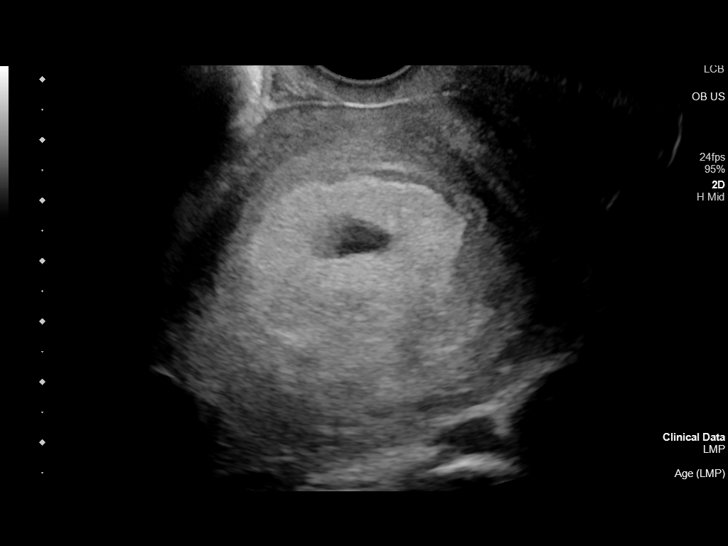
[im 52/70]
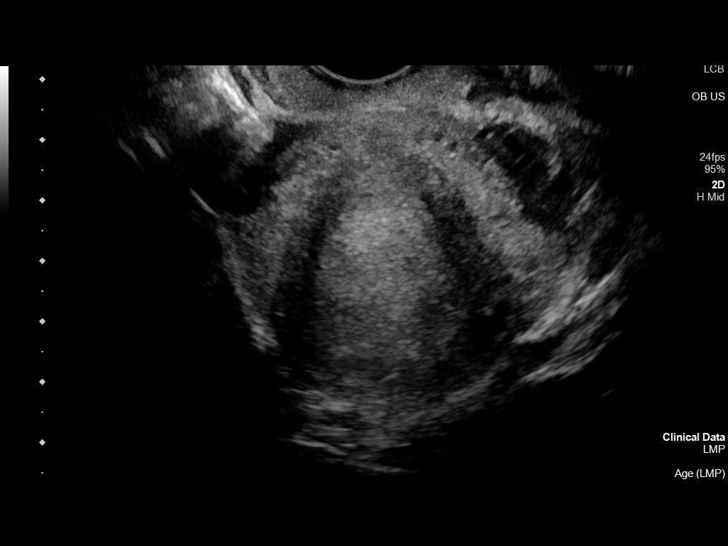
[im 57/70]
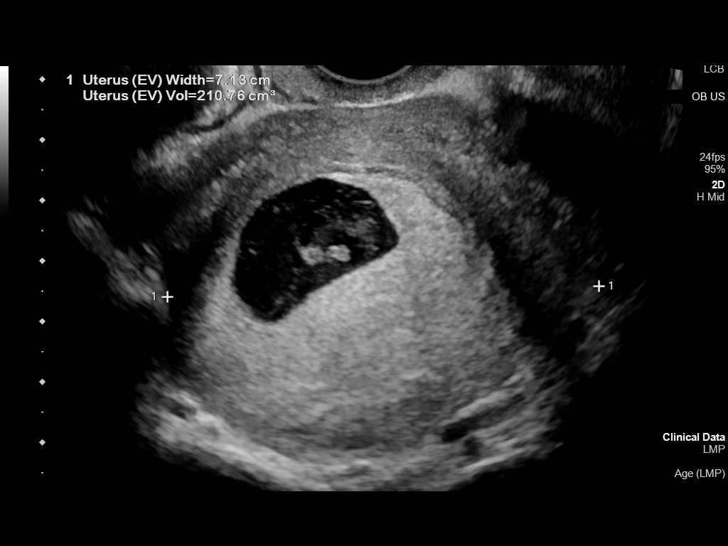
[im 62/70]
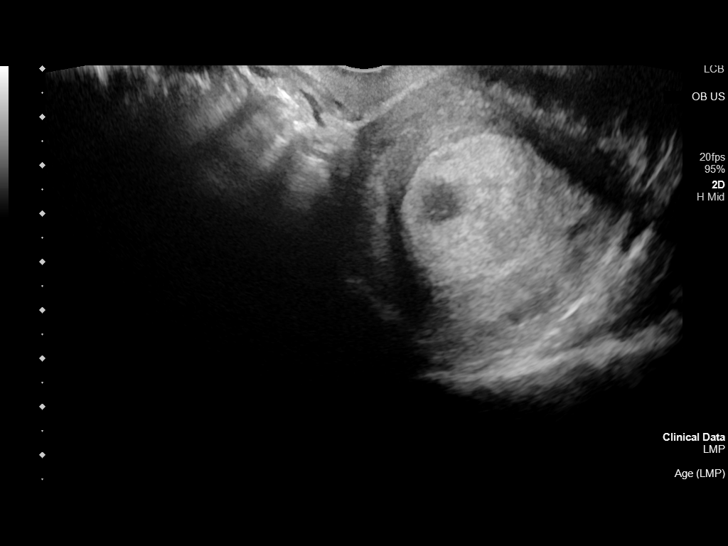
[im 67/70]
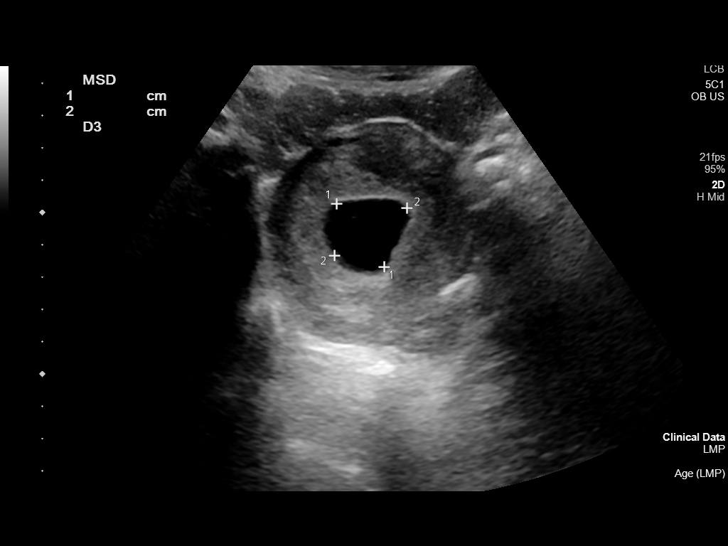

[13 of 28 positions shown; findings below may reference images not displayed]

FINDINGS: Intrauterine gestational sac: Single. Scattered low-level echoes
seen within the gestational sac.

Yolk sac:  Present

Embryo:  Present

Cardiac Activity: Negative.

Heart Rate: N/A  bpm

CRL: 3.9 mm   6 w   0 d                  US EDC: 01/30/2020

Subchorionic hemorrhage:  None visualized.

Maternal uterus/adnexae: Neither ovary visualized due to overlying
bowel gas. No adnexal mass or free fluid.
IMPRESSION: 1. Single IUP containing internal yolk sac and embryo, but no
detectable cardiac activity. Crown-rump length measures 3.9 mm.
Findings are suspicious but not yet definitive for failed pregnancy.
Recommend follow-up US in 10-14 days for definitive diagnosis. This
recommendation follows SRU consensus guidelines: Diagnostic Criteria
for Nonviable Pregnancy Early in the First Trimester. N Engl J Med
2. No other acute maternal uterine or adnexal abnormality
identified.
